# Patient Record
Sex: Female | Born: 1995 | Race: Black or African American | Hispanic: No | Marital: Single | Smoking: Never smoker
Health system: Southern US, Community
[De-identification: ages and names within clinical notes are randomized; demographics above are authoritative.]

## PROBLEM LIST (undated history)

## (undated) ENCOUNTER — Ambulatory Visit (HOSPITAL_COMMUNITY): Admission: EM | Discharge status: Absent without leave | Payer: BLUE CROSS/BLUE SHIELD

## (undated) ENCOUNTER — Ambulatory Visit

## (undated) DIAGNOSIS — F419 Anxiety disorder, unspecified: Secondary | ICD-10-CM

## (undated) DIAGNOSIS — E039 Hypothyroidism, unspecified: Secondary | ICD-10-CM

## (undated) HISTORY — DX: Anxiety disorder, unspecified: F41.9

## (undated) HISTORY — DX: Hypothyroidism, unspecified: E03.9

---
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
## 2017-02-08 ENCOUNTER — Ambulatory Visit: Payer: BLUE CROSS/BLUE SHIELD | Attending: Internal Medicine | Admitting: Internal Medicine

## 2017-02-08 ENCOUNTER — Encounter: Payer: Self-pay | Admitting: Internal Medicine

## 2017-02-08 VITALS — BP 116/73 | HR 98 | Temp 98.3°F | Resp 16 | Ht 65.0 in | Wt 248.0 lb

## 2017-02-08 DIAGNOSIS — Z8249 Family history of ischemic heart disease and other diseases of the circulatory system: Secondary | ICD-10-CM | POA: Insufficient documentation

## 2017-02-08 DIAGNOSIS — J309 Allergic rhinitis, unspecified: Secondary | ICD-10-CM | POA: Diagnosis not present

## 2017-02-08 DIAGNOSIS — E039 Hypothyroidism, unspecified: Secondary | ICD-10-CM | POA: Insufficient documentation

## 2017-02-08 DIAGNOSIS — Z79890 Hormone replacement therapy (postmenopausal): Secondary | ICD-10-CM | POA: Insufficient documentation

## 2017-02-08 DIAGNOSIS — Z803 Family history of malignant neoplasm of breast: Secondary | ICD-10-CM | POA: Diagnosis not present

## 2017-02-08 DIAGNOSIS — Z825 Family history of asthma and other chronic lower respiratory diseases: Secondary | ICD-10-CM | POA: Insufficient documentation

## 2017-02-08 DIAGNOSIS — Z807 Family history of other malignant neoplasms of lymphoid, hematopoietic and related tissues: Secondary | ICD-10-CM | POA: Diagnosis not present

## 2017-02-08 MED ORDER — FLUTICASONE PROPIONATE 50 MCG/ACT NA SUSP: 1.0000 | Freq: Every day | NASAL | 6 refills | Status: DC

## 2017-02-08 MED ORDER — LEVOTHYROXINE SODIUM 50 MCG PO TABS: 50.0000 ug | ORAL_TABLET | Freq: Every day | ORAL | 6 refills | Status: DC

## 2017-02-08 NOTE — Progress Notes (Signed)
Patient ID: Sarah Hart, female    DOB: [birth year]  MRN: [id number]  CC: New Patient (Initial Visit); Hypothyroidism; and URI   Subjective: Sarah Hart is a 22 y.o. female who presents for new pt visit.  Previous PCP was [facility] in [place], [place].  Pt is a Ship broker at [employer] Her concerns today include:  Pt with hx of hypothyroidism.  Dx at age 70.  She is on brand name Synthroid -last TSH was 03/2016. No hot/cold intolerance.  No constipation or diarrhea Forgets to take med 2-3 x a wk  Hx of allergies all yr round. Takes Benadryl at bedtime.  She switches b/w Benadryl, Claritin and Zyrtec.   Symptoms include nasal congestion, rhinorrhea and sinus headache    No current outpatient medications on file prior to visit.   No current facility-administered medications on file prior to visit.     Allergies not on file  Social History   Socioeconomic History  . Marital status: Single    Spouse name: Not on file  . Number of children: Not on file  . Years of education: Not on file  . Highest education level: Not on file  Social Needs  . Financial resource strain: Not on file  . Food insecurity - worry: Not on file  . Food insecurity - inability: Not on file  . Transportation needs - medical: Not on file  . Transportation needs - non-medical: Not on file  Occupational History  . Not on file  Tobacco Use  . Smoking status: Never Smoker  . Smokeless tobacco: Never Used  Substance and Sexual Activity  . Alcohol use: Yes    Comment: once or twice a month  . Drug use: No  . Sexual activity: No  Other Topics Concern  . Not on file  Social History Narrative  . Not on file    Family History  Problem Relation Age of Onset  . Hypertension Mother   . Breast cancer Mother        Mother was dx in 2009  . Hypertension Father   . Multiple myeloma Father 5  . Asthma Brother   . Pancreatic cancer Maternal Grandmother   . Prostate cancer Maternal  Grandfather     History reviewed. No pertinent surgical history.  ROS: Review of Systems Neg except as above PHYSICAL EXAM: BP 116/73   Pulse 98   Temp 98.3 F (36.8 C) (Oral)   Resp 16   Ht 5' 5"  (1.651 m)   Wt 248 lb (112.5 kg)   LMP 01/29/2017 Comment: Pt is on menstrual now  SpO2 98%   BMI 41.27 kg/m   Physical Exam  General appearance - alert, well appearing, young obese African-American female and in no distress Mental status - alert, oriented to person, place, and time, normal mood, behavior, speech, dress, motor activity, and thought processes Nose - normal and patent, no erythema, discharge or polyps Mouth - mucous membranes moist, pharynx normal without lesions Neck - supple, no significant adenopathy Chest - clear to auscultation, no wheezes, rales or rhonchi, symmetric air entry Heart - normal rate, regular rhythm, normal S1, S2, no murmurs, rubs, clicks or gallops Extremities - peripheral pulses normal, no pedal edema, no clubbing or cyanosis  ASSESSMENT AND PLAN: 1. Acquired hypothyroidism Encourage her to put her medication on the side of her toothbrush so that she remembers to take it every morning after brushing. - levothyroxine (SYNTHROID, LEVOTHROID) 50 MCG tablet; Take 1 tablet (50  mcg total) by mouth daily before breakfast.  Dispense: 30 tablet; Refill: 6 - TSH  2. Allergic rhinitis, unspecified seasonality, unspecified trigger - fluticasone (FLONASE) 50 MCG/ACT nasal spray; Place 1 spray into both nostrils daily.  Dispense: 16 g; Refill: 6 Patient was given the opportunity to ask questions.  Patient verbalized understanding of the plan and was able to repeat key elements of the plan.   Orders Placed This Encounter  Procedures  . TSH     Requested Prescriptions   Signed Prescriptions Disp Refills  . fluticasone (FLONASE) 50 MCG/ACT nasal spray 16 g 6    Sig: Place 1 spray into both nostrils daily.  Marland Kitchen levothyroxine (SYNTHROID, LEVOTHROID) 50 MCG  tablet 30 tablet 6    Sig: Take 1 tablet (50 mcg total) by mouth daily before breakfast.    Return in about 6 months (around 08/08/2017).  Karle Plumber, MD, FACP

## 2017-02-08 NOTE — Patient Instructions (Signed)
Hypothyroidism Hypothyroidism is a disorder of the thyroid. The thyroid is a large gland that is located in the lower front of the neck. The thyroid releases hormones that control how the body works. With hypothyroidism, the thyroid does not make enough of these hormones. What are the causes? Causes of hypothyroidism may include:  Viral infections.  Pregnancy.  Your own defense system (immune system) attacking your thyroid.  Certain medicines.  Birth defects.  Past radiation treatments to your head or neck.  Past treatment with radioactive iodine.  Past surgical removal of part or all of your thyroid.  Problems with the gland that is located in the center of your brain (pituitary).  What are the signs or symptoms? Signs and symptoms of hypothyroidism may include:  Feeling as though you have no energy (lethargy).  Inability to tolerate cold.  Weight gain that is not explained by a change in diet or exercise habits.  Dry skin.  Coarse hair.  Menstrual irregularity.  Slowing of thought processes.  Constipation.  Sadness or depression.  How is this diagnosed? Your health care provider may diagnose hypothyroidism with blood tests and ultrasound tests. How is this treated? Hypothyroidism is treated with medicine that replaces the hormones that your body does not make. After you begin treatment, it may take several weeks for symptoms to go away. Follow these instructions at home:  Take medicines only as directed by your health care provider.  If you start taking any new medicines, tell your health care provider.  Keep all follow-up visits as directed by your health care provider. This is important. As your condition improves, your dosage needs may change. You will need to have blood tests regularly so that your health care provider can watch your condition. Contact a health care provider if:  Your symptoms do not get better with treatment.  You are taking thyroid  replacement medicine and: ? You sweat excessively. ? You have tremors. ? You feel anxious. ? You lose weight rapidly. ? You cannot tolerate heat. ? You have emotional swings. ? You have diarrhea. ? You feel weak. Get help right away if:  You develop chest pain.  You develop an irregular heartbeat.  You develop a rapid heartbeat. This information is not intended to replace advice given to you by your health care provider. Make sure you discuss any questions you have with your health care provider. Document Released: 01/01/2005 Document Revised: 06/09/2015 Document Reviewed: 05/19/2013 Elsevier Interactive Patient Education  2018 [employer]. Allergic Rhinitis, Adult Allergic rhinitis is an allergic reaction that affects the mucous membrane inside the nose. It causes sneezing, a runny or stuffy nose, and the feeling of mucus going down the back of the throat (postnasal drip). Allergic rhinitis can be mild to severe. There are two types of allergic rhinitis:  Seasonal. This type is also called hay fever. It happens only during certain seasons.  Perennial. This type can happen at any time of the year.  What are the causes? This condition happens when the body's defense system (immune system) responds to certain harmless substances called allergens as though they were germs.  Seasonal allergic rhinitis is triggered by pollen, which can come from grasses, trees, and weeds. Perennial allergic rhinitis may be caused by:  House dust mites.  Pet dander.  Mold spores.  What are the signs or symptoms? Symptoms of this condition include:  Sneezing.  Runny or stuffy nose (nasal congestion).  Postnasal drip.  Itchy nose.  Tearing of the eyes.  Trouble sleeping.  Daytime sleepiness.  How is this diagnosed? This condition may be diagnosed based on:  Your medical history.  A physical exam.  Tests to check for related conditions, such as: ? Asthma. ? Pink eye. ? Ear  infection. ? Upper respiratory infection.  Tests to find out which allergens trigger your symptoms. These may include skin or blood tests.  How is this treated? There is no cure for this condition, but treatment can help control symptoms. Treatment may include:  Taking medicines that block allergy symptoms, such as antihistamines. Medicine may be given as a shot, nasal spray, or pill.  Avoiding the allergen.  Desensitization. This treatment involves getting ongoing shots until your body becomes less sensitive to the allergen. This treatment may be done if other treatments do not help.  If taking medicine and avoiding the allergen does not work, new, stronger medicines may be prescribed.  Follow these instructions at home:  Find out what you are allergic to. Common allergens include smoke, dust, and pollen.  Avoid the things you are allergic to. These are some things you can do to help avoid allergens: ? Replace carpet with wood, tile, or vinyl flooring. Carpet can trap dander and dust. ? Do not smoke. Do not allow smoking in your home. ? Change your heating and air conditioning filter at least once a month. ? During allergy season:  Keep windows closed as much as possible.  Plan outdoor activities when pollen counts are lowest. This is usually during the evening hours.  When coming indoors, change clothing and shower before sitting on furniture or bedding.  Take over-the-counter and prescription medicines only as told by your health care provider.  Keep all follow-up visits as told by your health care provider. This is important. Contact a health care provider if:  You have a fever.  You develop a persistent cough.  You make whistling sounds when you breathe (you wheeze).  Your symptoms interfere with your normal daily activities. Get help right away if:  You have shortness of breath. Summary  This condition can be managed by taking medicines as directed and avoiding  allergens.  Contact your health care provider if you develop a persistent cough or fever.  During allergy season, keep windows closed as much as possible. This information is not intended to replace advice given to you by your health care provider. Make sure you discuss any questions you have with your health care provider. Document Released: 09/26/2000 Document Revised: 02/09/2016 Document Reviewed: 02/09/2016 Elsevier Interactive Patient Education  [employer]2018 Else[employer] Inc[employer]

## 2017-02-09 LAB — TSH: TSH: 2.78 u[IU]/mL (ref 0.450–4.500)

## 2017-07-23 ENCOUNTER — Encounter: Payer: Self-pay | Admitting: Internal Medicine

## 2017-07-23 ENCOUNTER — Ambulatory Visit: Payer: BLUE CROSS/BLUE SHIELD | Attending: Internal Medicine | Admitting: Internal Medicine

## 2017-07-23 VITALS — BP 131/80 | HR 74 | Temp 98.2°F | Resp 16 | Wt 246.8 lb

## 2017-07-23 DIAGNOSIS — Z6841 Body Mass Index (BMI) 40.0 and over, adult: Secondary | ICD-10-CM | POA: Diagnosis not present

## 2017-07-23 DIAGNOSIS — Z79899 Other long term (current) drug therapy: Secondary | ICD-10-CM | POA: Insufficient documentation

## 2017-07-23 DIAGNOSIS — N926 Irregular menstruation, unspecified: Secondary | ICD-10-CM | POA: Diagnosis not present

## 2017-07-23 DIAGNOSIS — Z79890 Hormone replacement therapy (postmenopausal): Secondary | ICD-10-CM | POA: Diagnosis not present

## 2017-07-23 DIAGNOSIS — J309 Allergic rhinitis, unspecified: Secondary | ICD-10-CM | POA: Insufficient documentation

## 2017-07-23 DIAGNOSIS — E039 Hypothyroidism, unspecified: Secondary | ICD-10-CM | POA: Insufficient documentation

## 2017-07-23 DIAGNOSIS — Z713 Dietary counseling and surveillance: Secondary | ICD-10-CM | POA: Diagnosis not present

## 2017-07-23 DIAGNOSIS — E66813 Obesity, class 3: Secondary | ICD-10-CM | POA: Insufficient documentation

## 2017-07-23 DIAGNOSIS — Z8249 Family history of ischemic heart disease and other diseases of the circulatory system: Secondary | ICD-10-CM | POA: Diagnosis not present

## 2017-07-23 NOTE — Patient Instructions (Addendum)
Follow a Healthy Eating Plan - You can do it! Limit sugary drinks.  Avoid sodas, sweet tea, sport or energy drinks, or fruit drinks.  Drink water, lo-fat milk, or diet drinks. Limit snack foods.   Cut back on candy, cake, cookies, chips, ice cream.  These are a special treat, only in small amounts. Eat plenty of vegetables.  Especially dark green, red, and orange vegetables. Aim for at least 3 servings a day. More is better! Include fruit in your daily diet.  Whole fruit is much healthier than fruit juice! Limit "white" bread, "white" pasta, "white" rice.   Choose "100% whole grain" products, brown or wild rice. Avoid fatty meats. Try "Meatless Monday" and choose eggs or beans one day a week.  When eating meat, choose lean meats like chicken, [place], and fish.  Grill, broil, or bake meats instead of frying, and eat poultry without the skin. Eat less salt.  Avoid frozen pizzas, frozen dinners and salty foods.  Use seasonings other than salt in cooking.  This can help blood pressure and keep you from swelling Beer, wine and liquor have calories.  If you can safely drink alcohol, limit to 1 drink per day for women, 2 drinks for men   Polycystic Ovarian Syndrome Polycystic ovarian syndrome (PCOS) is a common hormonal disorder among women of reproductive age. In most women with PCOS, many small fluid-filled sacs (cysts) grow on the ovaries, and the cysts are not part of a normal menstrual cycle. PCOS can cause problems with your menstrual periods and make it difficult to get pregnant. It can also cause an increased risk of miscarriage with pregnancy. If it is not treated, PCOS can lead to serious health problems, such as diabetes and heart disease. What are the causes? The cause of PCOS is not known, but it may be the result of a combination of certain factors, such as:  Irregular menstrual cycle.  High levels of certain hormones (androgens).  Problems with the hormone that helps to control blood  sugar (insulin resistance).  Certain genes.  What increases the risk? This condition is more likely to develop in women who have a family history of PCOS. What are the signs or symptoms? Symptoms of PCOS may include:  Multiple ovarian cysts.  Infrequent periods or no periods.  Periods that are too frequent or too heavy.  Unpredictable periods.  Inability to get pregnant (infertility) because of not ovulating.  Increased growth of hair on the face, chest, stomach, back, thumbs, thighs, or toes.  Acne or oily skin. Acne may develop during adulthood, and it may not respond to treatment.  Pelvic pain.  Weight gain or obesity.  Patches of thickened and dark brown or black skin on the neck, arms, breasts, or thighs (acanthosis nigricans).  Excess hair growth on the face, chest, abdomen, or upper thighs (hirsutism).  How is this diagnosed? This condition is diagnosed based on:  Your medical history.  A physical exam, including a pelvic exam. Your health care provider may look for areas of increased hair growth on your skin.  Tests, such as: ? Ultrasound. This may be used to examine the ovaries and the lining of the uterus (endometrium) for cysts. ? Blood tests. These may be used to check levels of sugar (glucose), female hormone (testosterone), and female hormones (estrogen and progesterone) in your blood.  How is this treated? There is no cure for PCOS, but treatment can help to manage symptoms and prevent more health problems from developing. Treatment  varies depending on:  Your symptoms.  Whether you want to have a baby or whether you need birth control (contraception).  Treatment may include nutrition and lifestyle changes along with:  Progesterone hormone to start a menstrual period.  Birth control pills to help you have regular menstrual periods.  Medicines to make you ovulate, if you want to get pregnant.  Medicine to reduce excessive hair growth.  Surgery, in  severe cases. This may involve making small holes in one or both of your ovaries. This decreases the amount of testosterone that your body produces.  Follow these instructions at home:  Take over-the-counter and prescription medicines only as told by your health care provider.  Follow a healthy meal plan. This can help you reduce the effects of PCOS. ? Eat a healthy diet that includes lean proteins, complex carbohydrates, fresh fruits and vegetables, low-fat dairy products, and healthy fats. Make sure to eat enough fiber.  If you are overweight, lose weight as told by your health care provider. ? Losing 10% of your body weight may improve symptoms. ? Your health care provider can determine how much weight loss is best for you and can help you lose weight safely.  Keep all follow-up visits as told by your health care provider. This is important. Contact a health care provider if:  Your symptoms do not get better with medicine.  You develop new symptoms. This information is not intended to replace advice given to you by your health care provider. Make sure you discuss any questions you have with your health care provider. Document Released: 04/27/2004 Document Revised: 08/30/2015 Document Reviewed: 06/19/2015 Elsevier Interactive Patient Education  [employer]2018 Elsevier Inc.

## 2017-07-23 NOTE — Progress Notes (Signed)
Patient ID: Sarah Hart, female    DOB: 12/03/1995  MRN: [id number]  CC: Hypothyroidism   Subjective: Sarah Hart is a 22 y.o. female who presents for routine follow-up visit. Her concerns today include:  Patient with history of hypothyroidism and obesity.  Thyroid:  Compliant with levothyroxine. She reports irregular menses for the past 1 to 2 years.menses but sometimes skip 2-1/2 months.  Cycles last up to 2 weeks with moderate bleeding.  The last week of the menstrual cycle is usually very light.  Last menses was April 2019.  He is not sexually active and has never been.  She reports a little growth of hair under the chin.  She has never had a Pap smear.  Obesity: Going to gym almost daily for the past few weeks.  She is trying to lead a healthier lifestyle.     Patient Active Problem List   Diagnosis Date Noted  . Acquired hypothyroidism 02/08/2017  . Allergic rhinitis 02/08/2017     Current Outpatient Medications on File Prior to Visit  Medication Sig Dispense Refill  . fluticasone (FLONASE) 50 MCG/ACT nasal spray Place 1 spray into both nostrils daily. 16 g 6  . levothyroxine (SYNTHROID, LEVOTHROID) 50 MCG tablet Take 1 tablet (50 mcg total) by mouth daily before breakfast. 30 tablet 6   No current facility-administered medications on file prior to visit.     Not on File  Social History   Socioeconomic History  . Marital status: Single    Spouse name: Not on file  . Number of children: Not on file  . Years of education: Not on file  . Highest education level: Not on file  Occupational History  . Not on file  Social Needs  . Financial resource strain: Not on file  . Food insecurity:    Worry: Not on file    Inability: Not on file  . Transportation needs:    Medical: Not on file    Non-medical: Not on file  Tobacco Use  . Smoking status: Never Smoker  . Smokeless tobacco: Never Used  Substance and Sexual Activity  . Alcohol use: Yes     Comment: once or twice a month  . Drug use: No  . Sexual activity: Never  Lifestyle  . Physical activity:    Days per week: Not on file    Minutes per session: Not on file  . Stress: Not on file  Relationships  . Social connections:    Talks on phone: Not on file    Gets together: Not on file    Attends religious service: Not on file    Active member of club or organization: Not on file    Attends meetings of clubs or organizations: Not on file    Relationship status: Not on file  . Intimate partner violence:    Fear of current or ex partner: Not on file    Emotionally abused: Not on file    Physically abused: Not on file    Forced sexual activity: Not on file  Other Topics Concern  . Not on file  Social History Narrative  . Not on file    Family History  Problem Relation Age of Onset  . Hypertension Mother   . Breast cancer Mother        Mother was dx in 2009  . Hypertension Father   . Multiple myeloma Father 75  . Asthma Brother   . Pancreatic cancer Maternal Grandmother   .  Prostate cancer Maternal Grandfather     No past surgical history on file.  ROS: Review of Systems Negative except as stated above PHYSICAL EXAM: BP 131/80   Pulse 74   Temp 98.2 F (36.8 C) (Oral)   Resp 16   Wt 246 lb 12.8 oz (111.9 kg)   SpO2 97%   BMI 41.07 kg/m   Physical Exam  General appearance - alert, well appearing, and in no distress Mental status - normal mood, behavior, speech, dress, motor activity, and thought processes Neck - supple, no significant adenopathy Chest - clear to auscultation, no wheezes, rales or rhonchi, symmetric air entry Heart - normal rate, regular rhythm, normal S1, S2, no murmurs, rubs, clicks or gallops Skin -mild hyperpigmentation around the posterior neck line and the axilla  Lab Results  Component Value Date   TSH 2.780 02/08/2017    ASSESSMENT AND PLAN: 1. Irregular menses -Likely due to PCOS.  I had a discussion with her of what  PCOS is.  We discussed options to regulate her menses including use of birth control pills since she is not sexually active and does not desire pregnancy at this time.  Other option would be to give 1 week of Provera the first week of every month to induce withdrawal bleeding.  Patient states that she will think about it and let me know on next visit.  We also need to recheck thyroid level to make sure she is still within normal range. -We discussed recommendation for starting Pap smear screening at age 68.  However patient has never been sexually active and wants to hold off at this time.  2. Acquired hypothyroidism - TSH  3. Class 3 severe obesity without serious comorbidity with body mass index (BMI) of 40.0 to 44.9 in adult, unspecified obesity type ([place]) Commended her on starting an exercise program.  Dietary counseling given including cutting back on white carbohydrates, sugary drinks and red meat.  Patient was given the opportunity to ask questions.  Patient verbalized understanding of the plan and was able to repeat key elements of the plan.   Orders Placed This Encounter  Procedures  . TSH     Requested Prescriptions    No prescriptions requested or ordered in this encounter    Return in about 5 months (around 12/23/2017).  Karle Plumber, MD, FACP

## 2017-07-24 LAB — TSH: TSH: 2.96 u[IU]/mL (ref 0.450–4.500)

## 2017-07-27 ENCOUNTER — Encounter: Payer: Self-pay | Admitting: Internal Medicine

## 2017-10-20 ENCOUNTER — Ambulatory Visit (HOSPITAL_COMMUNITY)
Admission: EM | Admit: 2017-10-20 | Discharge: 2017-10-20 | Discharge status: Absent without leave | Payer: BLUE CROSS/BLUE SHIELD

## 2017-11-04 ENCOUNTER — Ambulatory Visit: Payer: BLUE CROSS/BLUE SHIELD | Admitting: Internal Medicine

## 2017-11-08 ENCOUNTER — Encounter: Payer: Self-pay | Admitting: Internal Medicine

## 2017-11-08 ENCOUNTER — Ambulatory Visit: Payer: BLUE CROSS/BLUE SHIELD | Attending: Internal Medicine | Admitting: Internal Medicine

## 2017-11-08 VITALS — BP 120/78 | HR 90 | Temp 98.0°F | Resp 16 | Wt 245.4 lb

## 2017-11-08 DIAGNOSIS — R109 Unspecified abdominal pain: Secondary | ICD-10-CM

## 2017-11-08 DIAGNOSIS — E039 Hypothyroidism, unspecified: Secondary | ICD-10-CM | POA: Insufficient documentation

## 2017-11-08 DIAGNOSIS — Z79890 Hormone replacement therapy (postmenopausal): Secondary | ICD-10-CM | POA: Insufficient documentation

## 2017-11-08 DIAGNOSIS — Z6841 Body Mass Index (BMI) 40.0 and over, adult: Secondary | ICD-10-CM | POA: Insufficient documentation

## 2017-11-08 DIAGNOSIS — Z2821 Immunization not carried out because of patient refusal: Secondary | ICD-10-CM

## 2017-11-08 DIAGNOSIS — N926 Irregular menstruation, unspecified: Secondary | ICD-10-CM | POA: Diagnosis not present

## 2017-11-08 DIAGNOSIS — Z8249 Family history of ischemic heart disease and other diseases of the circulatory system: Secondary | ICD-10-CM | POA: Insufficient documentation

## 2017-11-08 LAB — POCT URINALYSIS DIP (CLINITEK)
BILIRUBIN UA: NEGATIVE
BILIRUBIN UA: NEGATIVE mg/dL
Glucose, UA: NEGATIVE mg/dL
LEUKOCYTES UA: NEGATIVE
Nitrite, UA: NEGATIVE
POC,PROTEIN,UA: NEGATIVE
SPEC GRAV UA: 1.015 (ref 1.010–1.025)
Urobilinogen, UA: 1 E.U./dL
pH, UA: 6.5 (ref 5.0–8.0)

## 2017-11-08 MED ORDER — MEDROXYPROGESTERONE ACETATE 10 MG PO TABS: ORAL_TABLET | ORAL | 3 refills | Status: DC

## 2017-11-08 NOTE — Progress Notes (Signed)
Patient ID: Sarah Hart, female    DOB: Nov 16, 1995  MRN: [id number]  CC: Follow-up   Subjective: Sarah Hart is a 22 y.o. female who presents for chronic ds management. Her concerns today include:  Patient with history of hypothyroidism and obesity.  C/o soreness LT flank x 1 mth.  No initiating factors. No radiation Most noticeable when she lays down No dysuria or urinary frequency.  No hematuria  Irregular menses: Patient states that she thought about what we discussed on last visit and did some reading on PCOS.  She would like to be tried with birth control pills or Provera to induce regular cycles.  Reports that menses are still irregular.  Last cycle was in July of this year.  She is not sexually active.  She is also agreeable to having Pap smear done.    Patient Active Problem List   Diagnosis Date Noted  . Irregular menses 07/23/2017  . Class 3 severe obesity without serious comorbidity with body mass index (BMI) of 40.0 to 44.9 in adult ([place]) 07/23/2017  . Acquired hypothyroidism 02/08/2017  . Allergic rhinitis 02/08/2017     Current Outpatient Medications on File Prior to Visit  Medication Sig Dispense Refill  . fluticasone (FLONASE) 50 MCG/ACT nasal spray Place 1 spray into both nostrils daily. 16 g 6  . levothyroxine (SYNTHROID, LEVOTHROID) 50 MCG tablet Take 1 tablet (50 mcg total) by mouth daily before breakfast. 30 tablet 6   No current facility-administered medications on file prior to visit.     Not on File  Social History   Socioeconomic History  . Marital status: Single    Spouse name: Not on file  . Number of children: Not on file  . Years of education: Not on file  . Highest education level: Not on file  Occupational History  . Not on file  Social Needs  . Financial resource strain: Not on file  . Food insecurity:    Worry: Not on file    Inability: Not on file  . Transportation needs:    Medical: Not on file    Non-medical: Not on  file  Tobacco Use  . Smoking status: Never Smoker  . Smokeless tobacco: Never Used  Substance and Sexual Activity  . Alcohol use: Yes    Comment: once or twice a month  . Drug use: No  . Sexual activity: Never  Lifestyle  . Physical activity:    Days per week: Not on file    Minutes per session: Not on file  . Stress: Not on file  Relationships  . Social connections:    Talks on phone: Not on file    Gets together: Not on file    Attends religious service: Not on file    Active member of club or organization: Not on file    Attends meetings of clubs or organizations: Not on file    Relationship status: Not on file  . Intimate partner violence:    Fear of current or ex partner: Not on file    Emotionally abused: Not on file    Physically abused: Not on file    Forced sexual activity: Not on file  Other Topics Concern  . Not on file  Social History Narrative  . Not on file    Family History  Problem Relation Age of Onset  . Hypertension Mother   . Breast cancer Mother        Mother was dx in 2009  .  Hypertension Father   . Multiple myeloma Father 12  . Asthma Brother   . Pancreatic cancer Maternal Grandmother   . Prostate cancer Maternal Grandfather     No past surgical history on file.  ROS: Review of Systems Negative except as above   PHYSICAL EXAM: BP 120/78   Pulse 90   Temp 98 F (36.7 C) (Oral)   Resp 16   Wt 245 lb 6.4 oz (111.3 kg)   SpO2 98%   BMI 40.84 kg/m   Wt Readings from Last 3 Encounters:  11/08/17 245 lb 6.4 oz (111.3 kg)  07/23/17 246 lb 12.8 oz (111.9 kg)  02/08/17 248 lb (112.5 kg)   Physical Exam  General appearance - alert, well appearing, and in no distress Mental status - normal mood, behavior, speech, dress, motor activity, and thought processes Abdomen - soft, nontender, nondistended, no masses or organomegaly Musculoskeletal -no flank tenderness.  No tenderness on palpation of the thoracic and lumbar spine  Results for  orders placed or performed in visit on 11/08/17  POCT URINALYSIS DIP (CLINITEK)  Result Value Ref Range   Color, UA yellow yellow   Clarity, UA clear clear   Glucose, UA negative negative mg/dL   Bilirubin, UA negative negative   Ketones, POC UA negative negative mg/dL   Spec Grav, UA 1.015 1.010 - 1.025   Blood, UA small (A) negative   pH, UA 6.5 5.0 - 8.0   POC Protein UA Negative Negative, Trace   Urobilinogen, UA 1.0 0.2 or 1.0 E.U./dL   Nitrite, UA Negative Negative   Leukocytes, UA Negative Negative    ASSESSMENT AND PLAN: 1. Flank pain Likely MSK in nature.  I recommend using some over-the-counter icy hot rub or BenGay. Urine dip was positive for small amount of blood.  We will check a UA with blood count to see if there is RBCs in the urine. - POCT URINALYSIS DIP (CLINITEK)  2. Irregular menses Most likely anovulatory cycle due to PCOS.  She has history of hypothyroidism but TSH level has been good on last 2 checks. -We discussed risks and benefits of birth control pills or Provera.  Patient informed of risks of blood clots more so with combined birth control pills.  She opted to try the Provera.  She will take 1 tablet daily for the first 10 days of each month and should have withdrawal bleeding several days after stopping the Provera.  We will plan to do her Pap smear on follow-up visit in 6 weeks Patient declined urine pregnancy test today because she is not sexually active. - medroxyPROGESTERone (PROVERA) 10 MG tablet; 1 tab PO daily for the first 10 days of each month  Dispense: 10 tablet; Refill: 3  3. Influenza vaccination declined   Patient was given the opportunity to ask questions.  Patient verbalized understanding of the plan and was able to repeat key elements of the plan.   Orders Placed This Encounter  Procedures  . POCT URINALYSIS DIP (CLINITEK)     Requested Prescriptions   Signed Prescriptions Disp Refills  . medroxyPROGESTERone (PROVERA) 10 MG  tablet 10 tablet 3    Sig: 1 tab PO daily for the first 10 days of each month    Return in about 6 weeks (around 12/20/2017) for PAP.  Karle Plumber, MD, FACP

## 2017-11-08 NOTE — Patient Instructions (Signed)
Try [facility] Rub to the left flank.  This can be purchased over the counter.

## 2017-11-20 ENCOUNTER — Encounter: Payer: Self-pay | Admitting: Internal Medicine

## 2017-12-20 ENCOUNTER — Encounter: Payer: Self-pay | Admitting: Internal Medicine

## 2017-12-20 ENCOUNTER — Other Ambulatory Visit (HOSPITAL_COMMUNITY)
Admission: RE | Admit: 2017-12-20 | Discharge: 2017-12-20 | Disposition: A | Discharge status: Home / Self Care | Payer: BLUE CROSS/BLUE SHIELD | Source: Ambulatory Visit | Attending: Internal Medicine | Admitting: Internal Medicine

## 2017-12-20 ENCOUNTER — Ambulatory Visit: Payer: BLUE CROSS/BLUE SHIELD | Attending: Internal Medicine | Admitting: Internal Medicine

## 2017-12-20 VITALS — BP 122/82 | HR 72 | Temp 98.5°F | Resp 16 | Wt 247.8 lb

## 2017-12-20 DIAGNOSIS — N926 Irregular menstruation, unspecified: Secondary | ICD-10-CM | POA: Diagnosis not present

## 2017-12-20 DIAGNOSIS — Z803 Family history of malignant neoplasm of breast: Secondary | ICD-10-CM

## 2017-12-20 DIAGNOSIS — Z793 Long term (current) use of hormonal contraceptives: Secondary | ICD-10-CM | POA: Insufficient documentation

## 2017-12-20 DIAGNOSIS — Z6841 Body Mass Index (BMI) 40.0 and over, adult: Secondary | ICD-10-CM | POA: Diagnosis not present

## 2017-12-20 DIAGNOSIS — Z8249 Family history of ischemic heart disease and other diseases of the circulatory system: Secondary | ICD-10-CM | POA: Diagnosis not present

## 2017-12-20 DIAGNOSIS — N925 Other specified irregular menstruation: Secondary | ICD-10-CM | POA: Diagnosis not present

## 2017-12-20 DIAGNOSIS — Z124 Encounter for screening for malignant neoplasm of cervix: Secondary | ICD-10-CM | POA: Diagnosis not present

## 2017-12-20 DIAGNOSIS — E039 Hypothyroidism, unspecified: Secondary | ICD-10-CM | POA: Insufficient documentation

## 2017-12-20 DIAGNOSIS — Z79890 Hormone replacement therapy (postmenopausal): Secondary | ICD-10-CM | POA: Diagnosis not present

## 2017-12-20 NOTE — Patient Instructions (Signed)
You should inquire from your mother whether you had the HPV vaccines.

## 2017-12-20 NOTE — Progress Notes (Signed)
Patient ID: Sarah Hart, female    DOB: Jan 28, 1995  MRN: [id number]  CC: Gynecologic Exam   Subjective: Sarah Hart is a 22 y.o. female who presents for f/u on irregular menses and for PAP Her concerns today include:  Patient with history of hypothyroidism, and obesity.  Pt is G0P0 She is not sexually active.  She is not sure she received HPV vaccines This is her first pap smear. No vaginal dischg or irritation Menses: Menses have been irregular for the past 2 years.  Prior to that she had regular menses that came every month.  On last visit we gave her prescription for Provera.  Had withdrawal bleeding with Provera but bleeding was very heavy and lasted 2 wks. menses would usually last 2 weeks when she did have them prior to Provera but with Provera "it was the heaviest period I've ever had."    No family history of uterine or ovarian cancer. History of breast cancer in mother.  Her mother was diagnosed in her late 60s or early 77s.  Patient Active Problem List   Diagnosis Date Noted  . Irregular menses 07/23/2017  . Class 3 severe obesity without serious comorbidity with body mass index (BMI) of 40.0 to 44.9 in adult ([place]) 07/23/2017  . Acquired hypothyroidism 02/08/2017  . Allergic rhinitis 02/08/2017     Current Outpatient Medications on File Prior to Visit  Medication Sig Dispense Refill  . fluticasone (FLONASE) 50 MCG/ACT nasal spray Place 1 spray into both nostrils daily. 16 g 6  . levothyroxine (SYNTHROID, LEVOTHROID) 50 MCG tablet Take 1 tablet (50 mcg total) by mouth daily before breakfast. 30 tablet 6  . medroxyPROGESTERone (PROVERA) 10 MG tablet 1 tab PO daily for the first 10 days of each month (Patient not taking: Reported on 12/20/2017) 10 tablet 3   No current facility-administered medications on file prior to visit.     No Known Allergies  Social History   Socioeconomic History  . Marital status: Single    Spouse name: Not on file  . Number of  children: Not on file  . Years of education: Not on file  . Highest education level: Not on file  Occupational History  . Not on file  Social Needs  . Financial resource strain: Not on file  . Food insecurity:    Worry: Not on file    Inability: Not on file  . Transportation needs:    Medical: Not on file    Non-medical: Not on file  Tobacco Use  . Smoking status: Never Smoker  . Smokeless tobacco: Never Used  Substance and Sexual Activity  . Alcohol use: Yes    Comment: once or twice a month  . Drug use: No  . Sexual activity: Never  Lifestyle  . Physical activity:    Days per week: Not on file    Minutes per session: Not on file  . Stress: Not on file  Relationships  . Social connections:    Talks on phone: Not on file    Gets together: Not on file    Attends religious service: Not on file    Active member of club or organization: Not on file    Attends meetings of clubs or organizations: Not on file    Relationship status: Not on file  . Intimate partner violence:    Fear of current or ex partner: Not on file    Emotionally abused: Not on file    Physically abused: Not  on file    Forced sexual activity: Not on file  Other Topics Concern  . Not on file  Social History Narrative  . Not on file    Family History  Problem Relation Age of Onset  . Hypertension Mother   . Breast cancer Mother        Mother was dx in 2009  . Hypertension Father   . Multiple myeloma Father 50  . Asthma Brother   . Pancreatic cancer Maternal Grandmother   . Prostate cancer Maternal Grandfather     No past surgical history on file.  ROS: Review of Systems Negative except as above. PHYSICAL EXAM: BP 130/83   Pulse 72   Temp 98.5 F (36.9 C) (Oral)   Resp 16   Wt 247 lb 12.8 oz (112.4 kg)   SpO2 100%   BMI 41.24 kg/m   BP 122/82 Physical Exam  General appearance - alert, well appearing, and in no distress Mental status - normal mood, behavior, speech, dress, motor  activity, and thought processes Pelvic - normal external genitalia, vulva, vagina, cervix, uterus and adnexa  ASSESSMENT AND PLAN: 1. Pap smear for cervical cancer screening Advised patient to check with her mother to inquire whether she has had the HPV vaccines. She also one may want to inquire whether her mother was checked for the BRCA gene given that she was diagnosed with breast cancer at such a young age - Cytology - PAP  2. Irregular menses We will have her stop the Provera.  We discussed changing her to birth control pills to regulate her cycles.  Patient states that she will think about it and let me know.  She has no absolute contraindications to being on birth control pills.  I went over possible side effects of being on birth control pills including risks of DVT/PE  3.  Family history of breast cancer in mother  Patient was given the opportunity to ask questions.  Patient verbalized understanding of the plan and was able to repeat key elements of the plan.   No orders of the defined types were placed in this encounter.    Requested Prescriptions    No prescriptions requested or ordered in this encounter    No follow-ups on file.  Karle Plumber, MD, FACP

## 2017-12-20 NOTE — Progress Notes (Signed)
Pt wants to discuss the provera

## 2017-12-23 LAB — CYTOLOGY - PAP: Diagnosis: NEGATIVE

## 2018-02-11 ENCOUNTER — Other Ambulatory Visit: Payer: Self-pay | Admitting: Internal Medicine

## 2018-02-11 DIAGNOSIS — E039 Hypothyroidism, unspecified: Secondary | ICD-10-CM

## 2018-02-24 ENCOUNTER — Encounter: Payer: Self-pay | Admitting: Internal Medicine

## 2018-06-24 ENCOUNTER — Other Ambulatory Visit: Payer: Self-pay | Admitting: *Deleted

## 2018-06-24 DIAGNOSIS — Z20822 Contact with and (suspected) exposure to covid-19: Secondary | ICD-10-CM

## 2018-06-26 LAB — NOVEL CORONAVIRUS, NAA: SARS-CoV-2, NAA: NOT DETECTED

## 2018-08-14 ENCOUNTER — Other Ambulatory Visit: Payer: Self-pay | Admitting: Internal Medicine

## 2018-08-14 DIAGNOSIS — E039 Hypothyroidism, unspecified: Secondary | ICD-10-CM

## 2018-08-15 ENCOUNTER — Encounter: Payer: Self-pay | Admitting: Internal Medicine

## 2018-08-15 ENCOUNTER — Other Ambulatory Visit: Payer: Self-pay

## 2018-08-15 DIAGNOSIS — E039 Hypothyroidism, unspecified: Secondary | ICD-10-CM

## 2018-08-15 MED ORDER — SYNTHROID 50 MCG PO TABS: 50.0000 ug | ORAL_TABLET | Freq: Every day | ORAL | 0 refills | Status: DC

## 2018-08-15 NOTE — Telephone Encounter (Signed)
Patient mychart response

## 2018-08-21 ENCOUNTER — Ambulatory Visit: Payer: BC Managed Care – PPO | Attending: Internal Medicine | Admitting: Physician Assistant

## 2018-08-21 ENCOUNTER — Other Ambulatory Visit: Payer: Self-pay

## 2018-08-21 DIAGNOSIS — E039 Hypothyroidism, unspecified: Secondary | ICD-10-CM

## 2018-08-21 NOTE — Progress Notes (Signed)
Patient ID: Sarah Hart, female   DOB: 1995/04/02, 23 y.o.   MRN: [id number]   Sarah Hart, is a 23 y.o. female  QAS:[id number]  [id number]:[id number]  DOB - 29-Oct-1995  Subjective:  Chief Complaint and HPI: Sarah Hart is a 23 y.o. female here today for f/up on hypothyroid.  Not completely out of meds.  Needs RF.  No bloodwork in almost 1 year.  Denies hair loss, fatigue, weight loss or gain.  No problems or concerns.  Doing well.  ROS:   Constitutional:  No f/c, No night sweats, No unexplained weight loss. EENT:  No vision changes, No blurry vision, No hearing changes. No mouth, throat, or ear problems.  Respiratory: No cough, No SOB Cardiac: No CP, no palpitations GI:  No abd pain, No N/V/D. GU: No Urinary s/sx Musculoskeletal: No joint pain Neuro: No headache, no dizziness, no motor weakness.  Skin: No rash Endocrine:  No polydipsia. No polyuria.  Psych: Denies SI/HI  No problems updated.  ALLERGIES: No Known Allergies  PAST MEDICAL HISTORY: Past Medical History:  Diagnosis Date  . Hypothyroidism     MEDICATIONS AT HOME: Prior to Admission medications   Medication Sig Start Date End Date Taking? Authorizing Provider  fluticasone (FLONASE) 50 MCG/ACT nasal spray Place 1 spray into both nostrils daily. 02/08/17  Yes Ladell Pier, MD  SYNTHROID 50 MCG tablet Take 1 tablet (50 mcg total) by mouth daily. 08/15/18  Yes Ladell Pier, MD  medroxyPROGESTERone (PROVERA) 10 MG tablet 1 tab PO daily for the first 10 days of each month Patient not taking: Reported on 12/20/2017 11/08/17   Ladell Pier, MD     Objective:  EXAM:   Vitals:   08/21/18 1526  BP: 126/80  Pulse: 78  Temp: 98.5 F (36.9 C)  TempSrc: Oral  SpO2: 99%  Weight: 256 lb 3.2 oz (116.2 kg)    General appearance : A&OX3. NAD. Non-toxic-appearing HEENT: Atraumatic and Normocephalic.  PERRLA. EOM intact.  Chest/Lungs:  Breathing-non-labored, Good air entry bilaterally, breath  sounds normal without rales, rhonchi, or wheezing  CVS: S1 S2 regular, no murmurs, gallops, rubs  Neurology:  CN II-XII grossly intact, Non focal.   Psych:  TP linear. J/I WNL. Normal speech. Appropriate eye contact and affect.  Skin:  No Rash  Data Review No results found for: HGBA1C   Assessment & Plan   1. Acquired hypothyroidism Will assess for control and send Rx when labs are back - Thyroid Panel With TSH - Basic metabolic panel   Patient have been counseled extensively about nutrition and exercise  Return in about 6 months (around 02/21/2019) for PCP hypothyroidism.  The patient was given clear instructions to go to ER or return to medical center if symptoms don't improve, worsen or new problems develop. The patient verbalized understanding. The patient was told to call to get lab results if they haven't heard anything in the next week.     Freeman Caldron, PA-C [facility] [place], [place]   08/21/2018, 3:36 PM

## 2018-08-22 ENCOUNTER — Other Ambulatory Visit: Payer: Self-pay | Admitting: Physician Assistant

## 2018-08-22 DIAGNOSIS — E039 Hypothyroidism, unspecified: Secondary | ICD-10-CM

## 2018-08-22 LAB — BASIC METABOLIC PANEL
BUN/Creatinine Ratio: 12 (ref 9–23)
BUN: 9 mg/dL (ref 6–20)
CO2: 22 mmol/L (ref 20–29)
Calcium: 9.4 mg/dL (ref 8.7–10.2)
Chloride: 104 mmol/L (ref 96–106)
Creatinine, Ser: 0.78 mg/dL (ref 0.57–1.00)
GFR calc Af Amer: 124 mL/min/{1.73_m2} (ref >59)
GFR calc non Af Amer: 107 mL/min/{1.73_m2} (ref >59)
Glucose: 91 mg/dL (ref 65–99)
Potassium: 4.1 mmol/L (ref 3.5–5.2)
Sodium: 141 mmol/L (ref 134–144)

## 2018-08-22 LAB — THYROID PANEL WITH TSH
Free Thyroxine Index: 2.3 (ref 1.2–4.9)
T3 Uptake Ratio: 21 % — ABNORMAL LOW (ref 24–39)
T4, Total: 10.9 ug/dL (ref 4.5–12.0)
TSH: 4.17 u[IU]/mL (ref 0.450–4.500)

## 2018-08-22 MED ORDER — SYNTHROID 50 MCG PO TABS
50.0000 ug | ORAL_TABLET | Freq: Every day | ORAL | 1 refills | Status: DC
Start: 2018-08-22 — End: 2019-07-21

## 2018-08-25 ENCOUNTER — Telehealth: Payer: Self-pay | Admitting: *Deleted

## 2018-08-25 DIAGNOSIS — J309 Allergic rhinitis, unspecified: Secondary | ICD-10-CM

## 2018-08-25 MED ORDER — FLUTICASONE PROPIONATE 50 MCG/ACT NA SUSP: 1.0000 | Freq: Every day | NASAL | 0 refills | Status: DC

## 2018-09-15 ENCOUNTER — Other Ambulatory Visit: Payer: Self-pay | Admitting: Internal Medicine

## 2018-09-15 DIAGNOSIS — E039 Hypothyroidism, unspecified: Secondary | ICD-10-CM

## 2018-09-21 ENCOUNTER — Other Ambulatory Visit: Payer: Self-pay | Admitting: Internal Medicine

## 2018-09-21 DIAGNOSIS — J309 Allergic rhinitis, unspecified: Secondary | ICD-10-CM

## 2018-09-30 ENCOUNTER — Encounter: Payer: Self-pay | Admitting: Internal Medicine

## 2018-09-30 NOTE — Telephone Encounter (Signed)
Please refill if appropriate

## 2018-10-26 ENCOUNTER — Other Ambulatory Visit: Payer: Self-pay | Admitting: Internal Medicine

## 2018-10-26 DIAGNOSIS — J309 Allergic rhinitis, unspecified: Secondary | ICD-10-CM

## 2018-11-13 ENCOUNTER — Encounter: Payer: Self-pay | Admitting: Internal Medicine

## 2018-11-14 ENCOUNTER — Other Ambulatory Visit: Payer: Self-pay | Admitting: Internal Medicine

## 2018-11-14 DIAGNOSIS — Z20822 Contact with and (suspected) exposure to covid-19: Secondary | ICD-10-CM

## 2018-11-14 DIAGNOSIS — Z20828 Contact with and (suspected) exposure to other viral communicable diseases: Secondary | ICD-10-CM

## 2018-11-17 ENCOUNTER — Other Ambulatory Visit: Payer: Self-pay

## 2018-11-17 DIAGNOSIS — Z20822 Contact with and (suspected) exposure to covid-19: Secondary | ICD-10-CM

## 2018-11-19 LAB — NOVEL CORONAVIRUS, NAA: SARS-CoV-2, NAA: NOT DETECTED

## 2018-11-20 ENCOUNTER — Telehealth: Payer: Self-pay | Admitting: Internal Medicine

## 2018-11-20 NOTE — Telephone Encounter (Signed)
Patient called in returning a missed call. Patient states that she did receive her results in [place]. Patient expresses understanding.

## 2019-01-05 ENCOUNTER — Other Ambulatory Visit: Payer: Self-pay

## 2019-01-05 DIAGNOSIS — Z20828 Contact with and (suspected) exposure to other viral communicable diseases: Secondary | ICD-10-CM | POA: Diagnosis not present

## 2019-01-05 DIAGNOSIS — Z20822 Contact with and (suspected) exposure to covid-19: Secondary | ICD-10-CM

## 2019-01-07 LAB — NOVEL CORONAVIRUS, NAA: SARS-CoV-2, NAA: NOT DETECTED

## 2019-01-18 ENCOUNTER — Other Ambulatory Visit: Payer: Self-pay | Admitting: Internal Medicine

## 2019-01-18 DIAGNOSIS — J309 Allergic rhinitis, unspecified: Secondary | ICD-10-CM

## 2019-02-17 ENCOUNTER — Other Ambulatory Visit: Payer: Self-pay | Admitting: Internal Medicine

## 2019-02-17 DIAGNOSIS — J309 Allergic rhinitis, unspecified: Secondary | ICD-10-CM

## 2019-03-25 ENCOUNTER — Encounter: Payer: Self-pay | Admitting: Internal Medicine

## 2019-06-22 ENCOUNTER — Other Ambulatory Visit: Payer: Self-pay | Admitting: Physician Assistant

## 2019-06-22 DIAGNOSIS — E039 Hypothyroidism, unspecified: Secondary | ICD-10-CM

## 2019-07-15 ENCOUNTER — Ambulatory Visit
Admission: RE | Admit: 2019-07-15 | Discharge: 2019-07-15 | Disposition: A | Discharge status: Home / Self Care | Payer: No Typology Code available for payment source | Source: Ambulatory Visit | Attending: Obstetrics and Gynecology | Admitting: Obstetrics and Gynecology

## 2019-07-15 ENCOUNTER — Other Ambulatory Visit: Payer: Self-pay | Admitting: Obstetrics and Gynecology

## 2019-07-15 DIAGNOSIS — R7611 Nonspecific reaction to tuberculin skin test without active tuberculosis: Secondary | ICD-10-CM

## 2019-07-20 ENCOUNTER — Encounter: Payer: Self-pay | Admitting: Internal Medicine

## 2019-07-20 DIAGNOSIS — E039 Hypothyroidism, unspecified: Secondary | ICD-10-CM

## 2019-07-21 ENCOUNTER — Other Ambulatory Visit: Payer: Self-pay

## 2019-07-21 DIAGNOSIS — E039 Hypothyroidism, unspecified: Secondary | ICD-10-CM

## 2019-07-21 MED ORDER — SYNTHROID 50 MCG PO TABS: 50.0000 ug | ORAL_TABLET | Freq: Every day | ORAL | 0 refills | Status: DC

## 2019-08-14 ENCOUNTER — Encounter: Payer: Self-pay | Admitting: Internal Medicine

## 2019-08-14 ENCOUNTER — Ambulatory Visit: Payer: BC Managed Care – PPO | Attending: Internal Medicine | Admitting: Internal Medicine

## 2019-08-14 ENCOUNTER — Other Ambulatory Visit: Payer: Self-pay

## 2019-08-14 VITALS — BP 130/84 | HR 52 | Resp 16 | Wt 257.4 lb

## 2019-08-14 DIAGNOSIS — Z72821 Inadequate sleep hygiene: Secondary | ICD-10-CM

## 2019-08-14 DIAGNOSIS — Z6841 Body Mass Index (BMI) 40.0 and over, adult: Secondary | ICD-10-CM

## 2019-08-14 DIAGNOSIS — Z227 Latent tuberculosis: Secondary | ICD-10-CM | POA: Diagnosis not present

## 2019-08-14 DIAGNOSIS — E039 Hypothyroidism, unspecified: Secondary | ICD-10-CM | POA: Diagnosis not present

## 2019-08-14 DIAGNOSIS — Z23 Encounter for immunization: Secondary | ICD-10-CM

## 2019-08-14 DIAGNOSIS — R030 Elevated blood-pressure reading, without diagnosis of hypertension: Secondary | ICD-10-CM

## 2019-08-14 MED ORDER — SYNTHROID 50 MCG PO TABS: 50.0000 ug | ORAL_TABLET | Freq: Every day | ORAL | 11 refills | Status: DC

## 2019-08-14 NOTE — Progress Notes (Signed)
Patient ID: Sarah Hart, female    DOB: 01-16-1996  MRN: [id number]  CC: Hypothyroidism   Subjective: Sarah Hart is a 24 y.o. female who presents for chronic ds management Her concerns today include:  Hx of hypothyroid, fhx of breast CA in mother, obesity   Thyroid: Reports compliance with Synthroid.  Days when she feels a little tired.  But thinks this is due to poor sleep hygiene. She tries to get in bed around 10:30-11 p.m. she lays in bed looking at her cell phone.  Obesity: Working on trying to get her weight down.  She struggles with white carbs.  Feels she is not fill if she does not have rice, pasta, bread or other white carbs when she eats a meal.   She has started cooking a lot more.  Eating more veggies Not skipping meals the way she did when she was in undergrad.  When she skips she over eats. -started walking 3 days a wk.  Going to gym.   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  She had to have a TB skin test which she did last month and it was positive.  She subsequently had a negative chest x-ray.  She has spoken with the health department.  She wants to do the QuantiFERON gold test first to see if it is positive and then and only then when she consider prophylactic therapy with isoniazid.  She is not having any respiratory symptoms.  HM:  Complete COVID vaccine series.   Patient Active Problem List   Diagnosis Date Noted  . Family history of breast cancer in mother 12/20/2017  . Irregular menses 07/23/2017  . Class 3 severe obesity without serious comorbidity with body mass index (BMI) of 40.0 to 44.9 in adult ([place]) 07/23/2017  . Acquired hypothyroidism 02/08/2017  . Allergic rhinitis 02/08/2017     Current Outpatient Medications on File Prior to Visit  Medication Sig Dispense Refill  . fluticasone (FLONASE) 50 MCG/ACT nasal spray Place 1 spray into both nostrils daily. Please schedule PCP appt. 16 g 0  .  medroxyPROGESTERone (PROVERA) 10 MG tablet 1 tab PO daily for the first 10 days of each month (Patient not taking: Reported on 12/20/2017) 10 tablet 3   No current facility-administered medications on file prior to visit.    No Known Allergies  Social History   Socioeconomic History  . Marital status: Single    Spouse name: Not on file  . Number of children: Not on file  . Years of education: Not on file  . Highest education level: Not on file  Occupational History  . Not on file  Tobacco Use  . Smoking status: Never Smoker  . Smokeless tobacco: Never Used  Substance and Sexual Activity  . Alcohol use: Yes    Comment: once or twice a month  . Drug use: No  . Sexual activity: Never  Other Topics Concern  . Not on file  Social History Narrative  . Not on file   Social Determinants of Health   Financial Resource Strain:   . Difficulty of Paying Living Expenses:   Food Insecurity:   . Worried About Charity fundraiser in the Last Year:   . Arboriculturist in the Last Year:   Transportation Needs:   . Film/video editor (Medical):   Marland Kitchen Lack of Transportation (Non-Medical):   Physical Activity:   . Days of Exercise per Week:   .  Minutes of Exercise per Session:   Stress:   . Feeling of Stress :   Social Connections:   . Frequency of Communication with Friends and Family:   . Frequency of Social Gatherings with Friends and Family:   . Attends Religious Services:   . Active Member of Clubs or Organizations:   . Attends Archivist Meetings:   Marland Kitchen Marital Status:   Intimate Partner Violence:   . Fear of Current or Ex-Partner:   . Emotionally Abused:   Marland Kitchen Physically Abused:   . Sexually Abused:     Family History  Problem Relation Age of Onset  . Hypertension Mother   . Breast cancer Mother        Mother was dx in 2009  . Hypertension Father   . Multiple myeloma Father 31  . Asthma Brother   . Pancreatic cancer Maternal Grandmother   . Prostate cancer  Maternal Grandfather     No past surgical history on file.  ROS: Review of Systems Negative except as stated above  PHYSICAL EXAM: BP (!) 132/88   Pulse 52   Resp 16   Wt (!) 257 lb 6.4 oz (116.8 kg)   SpO2 98%   BMI 42.83 kg/m   Wt Readings from Last 3 Encounters:  08/14/19 (!) 257 lb 6.4 oz (116.8 kg)  08/21/18 256 lb 3.2 oz (116.2 kg)  12/20/17 247 lb 12.8 oz (112.4 kg)  BP 130/84   Physical Exam  General appearance - alert, well appearing, obese African-American female and in no distress Mental status - normal mood, behavior, speech, dress, motor activity, and thought processes Neck - supple, no significant adenopathy Chest - clear to auscultation, no wheezes, rales or rhonchi, symmetric air entry Heart - normal rate, regular rhythm, normal S1, S2, no murmurs, rubs, clicks or gallops Extremities - peripheral pulses normal, no pedal edema, no clubbing or cyanosis  CMP Latest Ref Rng & Units 08/21/2018  Glucose 65 - 99 mg/dL 91  BUN 6 - 20 mg/dL 9  Creatinine 0.57 - 1.00 mg/dL 0.78  Sodium 134 - 144 mmol/L 141  Potassium 3.5 - 5.2 mmol/L 4.1  Chloride 96 - 106 mmol/L 104  CO2 20 - 29 mmol/L 22  Calcium 8.7 - 10.2 mg/dL 9.4   ASSESSMENT AND PLAN: 1. Acquired hypothyroidism Check TSH today.  Continue Synthroid. - SYNTHROID 50 MCG tablet; Take 1 tablet (50 mcg total) by mouth daily.  Dispense: 30 tablet; Refill: 11 - TSH  2. Class 3 severe obesity due to excess calories without serious comorbidity with body mass index (BMI) of 40.0 to 44.9 in adult [facility]) Dietary counseling given along with printed information. Commended her on starting an exercise program.  Encouraged her to try to get in some exercise at least 3 to 4 days a week for 30 minutes  3. Poor sleep hygiene Discussed good sleep hygiene.  Advised to get in bed around about the same time every night.  Avoid drinking caffeinated beverages within 4 hours of bedtime.  Avoid excessive alcohol consumption prior to  bedtime.  Once in bed she should turn off all lights and sounds and put her cell phone on vibrate.  If unable to fall asleep within 45 minutes of being in bed, she should get up and try to do something until she feels sleepy.  4. TB lung, latent Discussed diagnosis of latent TB.  Patient want to have the QuantiFERON gold test done before considering doing prophylactic therapy - QuantiFERON-TB Gold  Plus  5. Elevated blood-pressure reading without diagnosis of hypertension DASH diet discussed and encouraged.  Follow-up with clinical pharmacist in 6 weeks for repeat blood pressure check.  6. Need for Tdap vaccination Given today   Patient was given the opportunity to ask questions.  Patient verbalized understanding of the plan and was able to repeat key elements of the plan.   Orders Placed This Encounter  Procedures  . TSH  . QuantiFERON-TB Gold Plus     Requested Prescriptions   Signed Prescriptions Disp Refills  . SYNTHROID 50 MCG tablet 30 tablet 11    Sig: Take 1 tablet (50 mcg total) by mouth daily.    Return in about 6 months (around 02/14/2020) for 1.5 mth with [facility] for repeat BP check.  Karle Plumber, MD, FACP

## 2019-08-14 NOTE — Patient Instructions (Signed)

## 2019-08-17 ENCOUNTER — Other Ambulatory Visit: Payer: Self-pay | Admitting: Internal Medicine

## 2019-08-17 DIAGNOSIS — E039 Hypothyroidism, unspecified: Secondary | ICD-10-CM

## 2019-08-18 LAB — QUANTIFERON-TB GOLD PLUS
QuantiFERON Mitogen Value: >10 IU/mL
QuantiFERON Nil Value: 0 IU/mL
QuantiFERON TB1 Ag Value: 1.12 IU/mL
QuantiFERON TB2 Ag Value: 0.77 IU/mL
QuantiFERON-TB Gold Plus: POSITIVE — AB

## 2019-08-18 LAB — TSH: TSH: 3.61 u[IU]/mL (ref 0.450–4.500)

## 2019-08-20 ENCOUNTER — Encounter: Payer: Self-pay | Admitting: Internal Medicine

## 2019-09-28 ENCOUNTER — Encounter: Payer: Self-pay | Admitting: Pharmacist

## 2019-09-28 ENCOUNTER — Ambulatory Visit: Payer: BC Managed Care – PPO | Admitting: Pharmacist

## 2019-09-28 ENCOUNTER — Ambulatory Visit: Payer: BC Managed Care – PPO | Attending: Internal Medicine | Admitting: Pharmacist

## 2019-09-28 ENCOUNTER — Other Ambulatory Visit: Payer: Self-pay

## 2019-09-28 VITALS — BP 110/78 | HR 76

## 2019-09-28 DIAGNOSIS — I10 Essential (primary) hypertension: Secondary | ICD-10-CM | POA: Diagnosis not present

## 2019-09-28 NOTE — Progress Notes (Signed)
   S:    PCP: Dr. Laural Benes  Patient arrives in good spirits. Presents to the clinic for hypertension evaluation, counseling, and management.  Patient was referred and last seen by Primary Care Provider on 08/14/2019.  BP at that visit 130/84. BP in the clinic has ranged from 116-131/73-84 mmHg since 2019.    Medication adherence: she does not currently take antihypertensive medication.  Current BP Medications include:  None   Antihypertensives tried in the past include: None   Dietary habits include: reports eating grilled chicken, broccoli and sweet potatoes for supper; sandwich and fruit for lunch. Compliant with salt restriction; denies drinking excess caffeine. Exercise habits include: reports combination of walking/home exercise but only 2-3 days over the past two weeks.  Family / Social history:  - FHx: asthma, HTN  - Tobacco: never smoker  - Alcohol: denies use    O:  There were no vitals filed for this visit.  Home BP readings: none   Last 3 Office BP readings: BP Readings from Last 3 Encounters:  08/14/19 (!) 130/84  08/21/18 126/80  12/20/17 122/82    BMET    Component Value Date/Time   NA 141 08/21/2018 1547   K 4.1 08/21/2018 1547   CL 104 08/21/2018 1547   CO2 22 08/21/2018 1547   GLUCOSE 91 08/21/2018 1547   BUN 9 08/21/2018 1547   CREATININE 0.78 08/21/2018 1547   CALCIUM 9.4 08/21/2018 1547   GFRNONAA 107 08/21/2018 1547   GFRAA 124 08/21/2018 1547    Renal function: CrCl cannot be calculated (Patient's most recent lab result is older than the maximum 21 days allowed.).  Clinical ASCVD: No  The ASCVD Risk score Denman George DC Jr., et al., 2013) failed to calculate for the following reasons:   The 2013 ASCVD risk score is only valid for ages 16 to 59   A/P: Hypertension undiagnosed. BP today is great. BP trend since 2019 looks good overall. She does not take BP medication currently. Recommend to continue with lifestyle change. No need for medication at  this time.   -Continued lifestyle changes.  -Counseled on lifestyle modifications for blood pressure control including reduced dietary sodium, increased exercise, adequate sleep.  Results reviewed and written information provided.   Total time in face-to-face counseling 15 minutes.   F/U Clinic Visit with PCP.    Butch Penny, PharmD, CPP Clinical Pharmacist [facility] [phone]

## 2019-10-07 ENCOUNTER — Encounter: Payer: Self-pay | Admitting: Internal Medicine

## 2019-10-08 ENCOUNTER — Other Ambulatory Visit: Payer: Self-pay | Admitting: Family Medicine

## 2019-10-08 DIAGNOSIS — F419 Anxiety disorder, unspecified: Secondary | ICD-10-CM

## 2019-10-14 ENCOUNTER — Telehealth: Payer: Self-pay | Admitting: Internal Medicine

## 2019-10-14 NOTE — Telephone Encounter (Signed)
Copied from CRM [id number]. Topic: Referral - Status >> Oct 13, 2019  1:05 PM Crist Infante wrote: Reason for CRM: Leavy Cella, have you seen referral fo this pt?  It was set to you.

## 2019-10-16 ENCOUNTER — Ambulatory Visit: Payer: BC Managed Care – PPO | Attending: Internal Medicine | Admitting: Pharmacist

## 2019-10-16 ENCOUNTER — Other Ambulatory Visit: Payer: Self-pay

## 2019-10-16 DIAGNOSIS — Z23 Encounter for immunization: Secondary | ICD-10-CM

## 2019-10-16 NOTE — Progress Notes (Signed)
Patient presents for vaccination against influenza per orders of Dr. Johnson. Consent given. Counseling provided. No contraindications exists. Vaccine administered without incident.  ° °Luke Van Ausdall, PharmD, CPP °Clinical Pharmacist °[facility] °[phone] ° °

## 2019-10-22 DIAGNOSIS — Z20822 Contact with and (suspected) exposure to covid-19: Secondary | ICD-10-CM | POA: Diagnosis not present

## 2019-11-02 ENCOUNTER — Ambulatory Visit: Payer: BC Managed Care – PPO | Attending: Internal Medicine | Admitting: Licensed Clinical Social Worker

## 2019-11-02 ENCOUNTER — Other Ambulatory Visit: Payer: Self-pay

## 2019-11-02 DIAGNOSIS — F419 Anxiety disorder, unspecified: Secondary | ICD-10-CM

## 2019-11-04 ENCOUNTER — Encounter: Payer: Self-pay | Admitting: Internal Medicine

## 2019-11-04 MED ORDER — SERTRALINE HCL 50 MG PO TABS: ORAL_TABLET | ORAL | 0 refills | Status: DC

## 2019-11-04 MED ORDER — HYDROXYZINE HCL 25 MG PO TABS: 25.0000 mg | ORAL_TABLET | Freq: Two times a day (BID) | ORAL | 0 refills | Status: DC | PRN

## 2019-11-20 NOTE — BH Specialist Note (Signed)
Integrated Behavioral Health Initial Visit  MRN: [id number] Name: Sarah Hart  Number of Integrated Behavioral Health Clinician visits:: 1/6 Session Start time: 1:40 PM  Session End time: 1:55 PM Total time: 15  Type of Service: Integrated Behavioral Health- Individual Interpretor:No. Interpretor Name and Language: NA   SUBJECTIVE: Sarah Hart is a 24 y.o. female accompanied by self Patient was referred by Dr. Laural Benes for anxiety. Patient reports the following symptoms/concerns: Pt reports difficulty managing anxiety symptoms  Duration of problem: Approx 4 weeks; Severity of problem: moderate  OBJECTIVE: Mood: Anxious and Affect: Appropriate Risk of harm to self or others: No plan to harm self or others   ASSESSMENT: Patient currently experiencing difficulty managing symptoms of anxiety.   Patient may benefit from therapy and medication management.  PLAN: 1. Follow up with behavioral health clinician on : Contact LCSW with any additional behavioral health and/or resource needs 2. Behavioral recommendations: Utilize healthy coping skills 3. Referral(s): Integrated Behavioral Health Services (In Clinic) 4. "From scale of 1-10, how likely are you to follow plan?":   Bridgett Larsson, LCSW 11/20/2019 7:39 AM

## 2019-12-08 ENCOUNTER — Other Ambulatory Visit: Payer: Self-pay | Admitting: Internal Medicine

## 2019-12-30 ENCOUNTER — Ambulatory Visit: Payer: BC Managed Care – PPO | Admitting: Physician Assistant

## 2020-02-18 ENCOUNTER — Other Ambulatory Visit: Payer: Self-pay

## 2020-02-18 ENCOUNTER — Encounter: Payer: Self-pay | Admitting: Internal Medicine

## 2020-02-18 ENCOUNTER — Ambulatory Visit: Payer: BC Managed Care – PPO | Attending: Internal Medicine | Admitting: Internal Medicine

## 2020-02-18 VITALS — Ht 65.0 in

## 2020-02-18 DIAGNOSIS — E039 Hypothyroidism, unspecified: Secondary | ICD-10-CM

## 2020-02-18 DIAGNOSIS — R41840 Attention and concentration deficit: Secondary | ICD-10-CM | POA: Diagnosis not present

## 2020-02-18 DIAGNOSIS — F418 Other specified anxiety disorders: Secondary | ICD-10-CM

## 2020-02-18 MED ORDER — BUSPIRONE HCL 5 MG PO TABS: 5.0000 mg | ORAL_TABLET | Freq: Two times a day (BID) | ORAL | 0 refills | Status: DC

## 2020-02-18 NOTE — Progress Notes (Signed)
Pt states she took the zoloft for 4 weeks pt states she doesn't think its happening in regards to her anxiety symptoms

## 2020-02-18 NOTE — Progress Notes (Signed)
Virtual Visit via Telephone Note  I connected with Sarah Hart on 02/18/20 at 10:45 a.m by telephone and verified that I am speaking with the correct person using two identifiers.  Location: Patient: home Provider: office   I discussed the limitations, risks, security and privacy concerns of performing an evaluation and management service by telephone and the availability of in person appointments. I also discussed with the patient that there may be a patient responsible charge related to this service. The patient expressed understanding and agreed to proceed.   History of Present Illness: Hx of hypothyroid, fhx of breast CA in mother, obesity, anxiety  Pt had contacted me in 10/2019 to report anxiety symptoms and requested med Started on Zoloft and Hydroxyzine Took the Zoloft fot 4 wks and did not feel it helped.  She took the hydroxyzine when needed but would only take half a tablet because it causes some drowsiness for her.  She does not feel that she needs to be on medication every day for the anxiety.  Her anxiety is situational and occurs when she has to present or speak publicly or in a situation where she is being observed.  Symptoms during these times include heart racing and shakiness.  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  On days when she has to do her internship she takes half a tablet of the hydroxyzine but does not feel that that controls her anxiety symptoms well. Spoke with LCSW and found it helpful.  She has reached out to a therapist and waiting for appt.  Feels she may be having symptoms of ADHD.  Her older brother was recently diagnosed and she feels she is having similar symptoms.  Has problems starting task and "sticking with things or be consistent.  If something does not bring instant gratification, I get bored."  Tends to procrastinate.    Hypothyroid:  Compliant with Levothyroxine  HM:  Due for COVID booster.  She plans to get  it  Outpatient Encounter Medications as of 02/18/2020  Medication Sig  . busPIRone (BUSPAR) 5 MG tablet Take 1 tablet (5 mg total) by mouth 2 (two) times daily.  . fluticasone (FLONASE) 50 MCG/ACT nasal spray Place 1 spray into both nostrils daily. Please schedule PCP appt.  . hydrOXYzine (ATARAX/VISTARIL) 25 MG tablet Take 1 tablet (25 mg total) by mouth 2 (two) times daily as needed for anxiety.  . medroxyPROGESTERone (PROVERA) 10 MG tablet 1 tab PO daily for the first 10 days of each month (Patient not taking: Reported on 12/20/2017)  . sertraline (ZOLOFT) 50 MG tablet TAKE 1/2 TABLET BY MOUTH DAILY FOR 2 TO 3 WEEKS THEN TAKE 1 TABLET BY MOUTH DAILY (Patient not taking: Reported on 02/18/2020)  . SYNTHROID 50 MCG tablet Take 1 tablet (50 mcg total) by mouth daily.   No facility-administered encounter medications on file as of 02/18/2020.      Observations/Objective: Depression screen [facility] 2/9 02/18/2020 08/14/2019 08/21/2018  Decreased Interest 1 1 0  Down, Depressed, Hopeless 0 1 0  PHQ - 2 Score 1 2 0  Altered sleeping - 0 0  Tired, decreased energy - 2 1  Change in appetite - 0 1  Feeling bad or failure about yourself  - 0 0  Trouble concentrating - 0 0  Moving slowly or fidgety/restless - 0 0  Suicidal thoughts - 0 0  PHQ-9 Score - 4 2   GAD 7 : Generalized Anxiety Score 02/18/2020 08/14/2019 08/21/2018  12/20/2017  Nervous, Anxious, on Edge 1 1 1  (No Data)  Control/stop worrying 2 1 0 (No Data)  Worry too much - different things 1 1 1  (No Data)  Trouble relaxing 0 0 0 (No Data)  Restless 0 0 0 (No Data)  Easily annoyed or irritable 0 0 0 (No Data)  Afraid - awful might happen 0 0 0 (No Data)  Total GAD 7 Score 4 3 2  -      Assessment and Plan: 1. Performance anxiety I recommend trying her on a low-dose of clonazepam to use as needed when she anticipates situations that would cause her to feel anxious.  Patient declined stating that she does not want to be on benzodiazepines.  She is  agreeable to trying BuSpar.  I informed her that BuSpar is meant to be taken several times a day but we can try having her use it only when needed even though I will write the prescription as twice daily dosing.  She is agreeable to this. - busPIRone (BUSPAR) 5 MG tablet; Take 1 tablet (5 mg total) by mouth 2 (two) times daily.  Dispense: 60 tablet; Refill: 0 - Ambulatory referral to Psychiatry  2. Attention deficit Recommend referral to psychiatry for formal evaluation and diagnosis. - Ambulatory referral to Psychiatry  3. Acquired hypothyroidism Continue levothyroxine. - TSH; Future   Follow Up Instructions: 4 mths   I discussed the assessment and treatment plan with the patient. The patient was provided an opportunity to ask questions and all were answered. The patient agreed with the plan and demonstrated an understanding of the instructions.   The patient was advised to call back or seek an in-person evaluation if the symptoms worsen or if the condition fails to improve as anticipated.  I provided 16 minutes of non-face-to-face time during this encounter.   , MD

## 2020-02-26 DIAGNOSIS — Z23 Encounter for immunization: Secondary | ICD-10-CM | POA: Diagnosis not present

## 2020-04-18 ENCOUNTER — Telehealth (INDEPENDENT_AMBULATORY_CARE_PROVIDER_SITE_OTHER): Payer: BC Managed Care – PPO | Admitting: Psychiatry

## 2020-04-18 ENCOUNTER — Encounter (HOSPITAL_COMMUNITY): Payer: Self-pay | Admitting: Psychiatry

## 2020-04-18 DIAGNOSIS — F341 Dysthymic disorder: Secondary | ICD-10-CM | POA: Diagnosis not present

## 2020-04-18 DIAGNOSIS — F4010 Social phobia, unspecified: Secondary | ICD-10-CM

## 2020-04-18 DIAGNOSIS — F418 Other specified anxiety disorders: Secondary | ICD-10-CM | POA: Diagnosis not present

## 2020-04-18 MED ORDER — BUPROPION HCL ER (SR) 100 MG PO TB12
100.0000 mg | ORAL_TABLET | Freq: Every day | ORAL | 0 refills | Status: DC
Start: 2020-04-18 — End: 2020-05-17

## 2020-04-18 MED ORDER — BUSPIRONE HCL 5 MG PO TABS: 5.0000 mg | ORAL_TABLET | Freq: Two times a day (BID) | ORAL | 0 refills | Status: DC

## 2020-04-18 NOTE — Progress Notes (Signed)
Psychiatric Initial Adult Assessment   Patient Identification: Sarah Hart MRN:  [id number] Date of Evaluation:  04/18/2020 Referral Source: primary care Chief Complaint:  establish care, anxiety, inattention Visit Diagnosis:    ICD-10-CM   1. 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 Dysthymia  F34.1    Virtual Visit via Video Note  I connected with [name] on 04/18/20 at  2:00 PM EDT by a video enabled telemedicine application and verified that I am speaking with the correct person using two identifiers.  Location: Patient: home Provider: home office   I discussed the limitations of evaluation and management by telemedicine and the availability of in person appointments. The patient expressed understanding and agreed to proceed.      I discussed the assessment and treatment plan with the patient. The patient was provided an opportunity to ask questions and all were answered. The patient agreed with the plan and demonstrated an understanding of the instructions.   The patient was advised to call back or seek an in-person evaluation if the symptoms worsen or if the condition fails to improve as anticipated.  I provided 40 minutes of non-face-to-face time during this encounter, including chart review, compiling and documentation.    History of Present Illness: Patient is a 25 years old, single African-American female referred by primary care physician to establish care for possible anxiety and inattention  Patient has suffered from anxiety especially social anxiety and performance anxiety she has been on sertraline but it did not help her.  She has been taking hydroxyzine for performance it does make her somewhat sleepy but it helps her performance if she takes a half a dose.  She takes BuSpar recently started that has helped anxiety and excessive worries in general.  She still endorses worries but not excessive  as before  Her complaint remains regarding procrastination unable to finish task at time difficulty focusing and struggling with complete sustained task.  She is graduating in general she is having reasonable grades and also in high school she had good grades.  She feels because of a motivation or not able to finish things on time she is less productive  She also endorses feeling down at times low-grade depression decreased motivation or energy but not crying spells or hopelessness or suicidal thoughts  Does not endorse psychotic symptoms, manic symptoms or panic attacks  Denies using drugs or alcohol     Aggravating factor: performance anxiety, Modifying factor: family, mom, friends Duration since young age  No prior psych admission or suicide attempt   Past Psychiatric History: anxiety  Previous Psychotropic Medications: Yes   Substance Abuse History in the last 12 months:  No.  Consequences of Substance Abuse: NA  Past Medical History:  Past Medical History:  Diagnosis Date  . Hypothyroidism    History reviewed. No pertinent surgical history.  Family Psychiatric History: denies  Family History:  Family History  Problem Relation Age of Onset  . Hypertension Mother   . Breast cancer Mother        Mother was dx in 2009  . Hypertension Father   . Multiple myeloma Father 64  . Asthma Brother   . Pancreatic cancer Maternal Grandmother   . Prostate cancer Maternal Grandfather     Social History:   Social History   Socioeconomic History  . Marital status: Single    Spouse name: Not on file  . Number of children: Not  on file  . Years of education: Not on file  . Highest education level: Not on file  Occupational History  . Not on file  Tobacco Use  . Smoking status: Never Smoker  . Smokeless tobacco: Never Used  Substance and Sexual Activity  . Alcohol use: Yes    Comment: once or twice a month  . Drug use: No  . Sexual activity: Never  Other Topics  Concern  . Not on file  Social History Narrative  . Not on file   Social Determinants of Health   Financial Resource Strain: Not on file  Food Insecurity: Not on file  Transportation Needs: Not on file  Physical Activity: Not on file  Stress: Not on file  Social Connections: Not on file    Additional Social History: grew up with mostly mom, elder sibling brothers, no trauma Had difficult around middle school with anxiety, did well in grades in high school  Allergies:  No Known Allergies  Metabolic Disorder Labs: No results found for: HGBA1C, MPG No results found for: PROLACTIN No results found for: CHOL, TRIG, HDL, CHOLHDL, VLDL, LDLCALC Lab Results  Component Value Date   TSH 3.610 08/14/2019    Therapeutic Level Labs: No results found for: LITHIUM No results found for: CBMZ No results found for: VALPROATE  Current Medications: Current Outpatient Medications  Medication Sig Dispense Refill  . buPROPion (WELLBUTRIN SR) 100 MG 12 hr tablet Take 1 tablet (100 mg total) by mouth daily. 30 tablet 0  . busPIRone (BUSPAR) 5 MG tablet Take 1 tablet (5 mg total) by mouth 2 (two) times daily. 60 tablet 0  . fluticasone (FLONASE) 50 MCG/ACT nasal spray Place 1 spray into both nostrils daily. Please schedule PCP appt. 16 g 0  . hydrOXYzine (ATARAX/VISTARIL) 25 MG tablet Take 1 tablet (25 mg total) by mouth 2 (two) times daily as needed for anxiety. 60 tablet 0  . medroxyPROGESTERone (PROVERA) 10 MG tablet 1 tab PO daily for the first 10 days of each month (Patient not taking: Reported on 12/20/2017) 10 tablet 3  . SYNTHROID 50 MCG tablet Take 1 tablet (50 mcg total) by mouth daily. 30 tablet 11   No current facility-administered medications for this visit.      Psychiatric Specialty Exam: Review of Systems  Cardiovascular: Negative for chest pain.  Psychiatric/Behavioral: Positive for decreased concentration. Negative for agitation and self-injury.    There were no vitals  taken for this visit.There is no height or weight on file to calculate BMI.  General Appearance: Casual  Eye Contact:  Fair  Speech:  Normal Rate  Volume:  Normal  Mood:  somewhat subdued  Affect:  Congruent  Thought Process:  Goal Directed  Orientation:  Full (Time, Place, and Person)  Thought Content:  Rumination  Suicidal Thoughts:  No  Homicidal Thoughts:  No  Memory:  Immediate;   Fair Recent;   Fair  Judgement:  Fair  Insight:  Fair  Psychomotor Activity:  Decreased  Concentration:  Concentration: Fair and Attention Span: Fair  Recall:  [employer] of Knowledge:Good  Language: Good  Akathisia:  No  Handed:    AIMS (if indicated):  not done  Assets:  Desire for Improvement Financial Resources/Insurance Housing Social Support  ADL's:  Intact  Cognition: WNL  Sleep:  Fair   Screenings: GAD-7   Flowsheet Row Telemedicine from 02/18/2020 in [place] Office Visit from 08/14/2019 in [place]  Visit from 08/21/2018 in [place] Office Visit from 11/08/2017 in [place] Office Visit from 02/08/2017 in [place]  Total GAD-7 Score _0 0    PHQ2-9   [place] from 02/18/2020 in [place] Office Visit from 08/14/2019 in [place] Office Visit from 08/21/2018 in [place] Office Visit from 11/08/2017 in [place] Office Visit from 02/08/2017 in [place]  PHQ-2 Total Score 1 2 0 2 0  PHQ-9 Total Score -- _1 --      Assessment and Plan: as follows  Social anxiety disorder; somewhat better on the BuSpar continue once or twice a day Performance anxiety Vistaril does help she can take half a tablet discussed distraction also she is planning to  start therapy  ADHD; 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

## 2020-05-04 DIAGNOSIS — F341 Dysthymic disorder: Secondary | ICD-10-CM | POA: Diagnosis not present

## 2020-05-04 DIAGNOSIS — F411 Generalized anxiety disorder: Secondary | ICD-10-CM | POA: Diagnosis not present

## 2020-05-12 DIAGNOSIS — F411 Generalized anxiety disorder: Secondary | ICD-10-CM | POA: Diagnosis not present

## 2020-05-12 DIAGNOSIS — F341 Dysthymic disorder: Secondary | ICD-10-CM | POA: Diagnosis not present

## 2020-05-17 ENCOUNTER — Other Ambulatory Visit (HOSPITAL_COMMUNITY): Payer: Self-pay | Admitting: Psychiatry

## 2020-05-25 ENCOUNTER — Encounter (HOSPITAL_COMMUNITY): Payer: Self-pay | Admitting: Psychiatry

## 2020-05-25 ENCOUNTER — Telehealth (INDEPENDENT_AMBULATORY_CARE_PROVIDER_SITE_OTHER): Payer: BC Managed Care – PPO | Admitting: Psychiatry

## 2020-05-25 DIAGNOSIS — F418 Other specified anxiety disorders: Secondary | ICD-10-CM | POA: Diagnosis not present

## 2020-05-25 DIAGNOSIS — F4010 Social phobia, unspecified: Secondary | ICD-10-CM

## 2020-05-25 DIAGNOSIS — F341 Dysthymic disorder: Secondary | ICD-10-CM

## 2020-05-25 MED ORDER — BUPROPION HCL ER (SR) 150 MG PO TB12: 150.0000 mg | ORAL_TABLET | Freq: Every day | ORAL | 0 refills | Status: DC

## 2020-05-25 NOTE — Progress Notes (Signed)
[place] Follow up visit   Patient Identification: Sarah Hart MRN:  [id number] Date of Evaluation:  05/25/2020 Referral Source: primary care Chief Complaint:   follow up depression, anxiety  Visit Diagnosis:    ICD-10-CM   1. Dysthymia  F34.1   2. Social anxiety disorder  F40.10   3. Performance anxiety  F41.8    Virtual Visit via Video Note  I connected with [name] on 05/25/20 at  2:00 PM EDT by a video enabled telemedicine application and verified that I am speaking with the correct person using two identifiers.  Location: Patient: home Provider: home office   I discussed the limitations of evaluation and management by telemedicine and the availability of in person appointments. The patient expressed understanding and agreed to proceed.   I discussed the assessment and treatment plan with the patient. The patient was provided an opportunity to ask questions and all were answered. The patient agreed with the plan and demonstrated an understanding of the instructions.   The patient was advised to call back or seek an in-person evaluation if the symptoms worsen or if the condition fails to improve as anticipated.  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  Patient has suffered from anxiety especially social anxiety and performance anxiety she takes buspar that has helped  Last visit added wellbutrin for depression and inattention She sees [place] at [facility] for therapy   Some improvement with wellbutrin,   Does not endorse psychotic symptoms, manic symptoms or panic attacks  Denies using drugs or alcohol  Aggravating  factor: performance anxiety, Modifying factor:family , friends  Duration since young age  No prior psych admission or suicide attempt   Past Psychiatric History: anxiety  Previous Psychotropic Medications: Yes    Past Medical History:  Past Medical History:  Diagnosis Date  . Hypothyroidism    History reviewed. No pertinent surgical history.  Family Psychiatric History: denies  Family History:  Family History  Problem Relation Age of Onset  . Hypertension Mother   . Breast cancer Mother        Mother was dx in 2009  . Hypertension Father   . Multiple myeloma Father 39  . Asthma Brother   . Pancreatic cancer Maternal Grandmother   . Prostate cancer Maternal Grandfather     Social History:   Social History   Socioeconomic History  . Marital status: Single    Spouse name: Not on file  . Number of children: Not on file  . Years of education: Not on file  . Highest education level: Not on file  Occupational History  . Not on file  Tobacco Use  . Smoking status: Never Smoker  . Smokeless tobacco: Never Used  Substance and Sexual Activity  . Alcohol use: Yes    Comment: once or twice a month  . Drug use: No  . Sexual activity: Never  Other Topics Concern  . Not on file  Social History Narrative  . Not on file   Social Determinants of Health   Financial Resource Strain: Not on file  Food Insecurity: Not on file  Transportation Needs: Not on file  Physical Activity: Not on  file  Stress: Not on file  Social Connections: Not on file      Allergies:  No Known Allergies  Metabolic Disorder Labs: No results found for: HGBA1C, MPG No results found for: PROLACTIN No results found for: CHOL, TRIG, HDL, CHOLHDL, VLDL, LDLCALC Lab Results  Component Value Date   TSH 3.610 08/14/2019    Therapeutic Level Labs: No results found for: LITHIUM No results found for: CBMZ No results found for: VALPROATE  Current Medications: Current Outpatient Medications   Medication Sig Dispense Refill  . buPROPion (WELLBUTRIN SR) 150 MG 12 hr tablet Take 1 tablet (150 mg total) by mouth daily. 30 tablet 0  . busPIRone (BUSPAR) 5 MG tablet Take 1 tablet (5 mg total) by mouth 2 (two) times daily. 60 tablet 0  . fluticasone (FLONASE) 50 MCG/ACT nasal spray Place 1 spray into both nostrils daily. Please schedule PCP appt. 16 g 0  . hydrOXYzine (ATARAX/VISTARIL) 25 MG tablet Take 1 tablet (25 mg total) by mouth 2 (two) times daily as needed for anxiety. 60 tablet 0  . medroxyPROGESTERone (PROVERA) 10 MG tablet 1 tab PO daily for the first 10 days of each month (Patient not taking: Reported on 12/20/2017) 10 tablet 3  . SYNTHROID 50 MCG tablet Take 1 tablet (50 mcg total) by mouth daily. 30 tablet 11   No current facility-administered medications for this visit.      Psychiatric Specialty Exam: Review of Systems  Cardiovascular: Negative for chest pain.  Psychiatric/Behavioral: Negative for agitation and self-injury.    There were no vitals taken for this visit.There is no height or weight on file to calculate BMI.  General Appearance: Casual  Eye Contact:  Fair  Speech:  Normal Rate  Volume:  Normal  Mood:  somewhat subdued  Affect:  Congruent  Thought Process:  Goal Directed  Orientation:  Full (Time, Place, and Person)  Thought Content:  Rumination  Suicidal Thoughts:  No  Homicidal Thoughts:  No  Memory:  Immediate;   Fair Recent;   Fair  Judgement:  Fair  Insight:  Fair  Psychomotor Activity:  Decreased  Concentration:  Concentration: Fair and Attention Span: Fair  Recall:  [employer] of Knowledge:Good  Language: Good  Akathisia:  No  Handed:    AIMS (if indicated):  not done  Assets:  Desire for Improvement Financial Resources/Insurance Housing Social Support  ADL's:  Intact  Cognition: WNL  Sleep:  Fair   Screenings: GAD-7   Flowsheet Row Telemedicine from 02/18/2020 in [place] Office Visit from  08/14/2019 in [place] Office Visit from 08/21/2018 in [place] Office Visit from 11/08/2017 in [place] Office Visit from 02/08/2017 in [place]  Total GAD-7 Score 4 3 2 8  0    PHQ2-9   Flowsheet Row Video Visit from 04/18/2020 in [place] from 02/18/2020 in [place] Office Visit from 08/14/2019 in [place] Office Visit from 08/21/2018 in [place] Office Visit from 11/08/2017 in [place]  PHQ-2 Total Score 1 1 2  0 2  PHQ-9 Total Score -- -- 4 2 5     Flowsheet Row Video Visit from 05/25/2020 in [place] Video Visit from 04/18/2020 in [place]  C-SSRS RISK CATEGORY No Risk No Risk      Assessment and Plan: as follows  Social anxiety disorder; better with buspar, continue  ADHD; 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

## 2020-06-17 ENCOUNTER — Encounter: Payer: Self-pay | Admitting: Internal Medicine

## 2020-06-17 ENCOUNTER — Ambulatory Visit: Payer: BC Managed Care – PPO | Attending: Internal Medicine | Admitting: Internal Medicine

## 2020-06-17 ENCOUNTER — Other Ambulatory Visit: Payer: Self-pay

## 2020-06-17 VITALS — BP 120/77 | HR 92 | Resp 16 | Wt 248.8 lb

## 2020-06-17 DIAGNOSIS — E039 Hypothyroidism, unspecified: Secondary | ICD-10-CM | POA: Diagnosis not present

## 2020-06-17 DIAGNOSIS — F418 Other specified anxiety disorders: Secondary | ICD-10-CM

## 2020-06-17 MED ORDER — SYNTHROID 50 MCG PO TABS
50.0000 ug | ORAL_TABLET | Freq: Every day | ORAL | 11 refills | Status: DC
Start: 2020-06-17 — End: 2020-09-15

## 2020-06-17 NOTE — Progress Notes (Signed)
Patient ID: Sarah Hart, female    DOB: [birth year]  MRN: [id number]  CC: Hypothyroidism   Subjective: Sarah Hart is a 25 y.o. female who presents for chronic ds management Her concerns today include:  Hx of hypothyroid, fhx of breast CA in mother, obesity, anxiety  Anx:  Better with Buspar. She has been taking it once a day. She has been seeing psychiatrist Dr. De Nurse.  Started on Wellbutrin which she also finds to be helpful..  Graduated with her MS from [employer].  This was a lot of her stress.  She will start working the end of this month.  She is working towards becoming a licensed Holiday representative.  Hypothyroid:  Compliant with Synthroid.  No hair loss, constipation or cold intolerance.  Obesity:  Down 11 lbs in past yr Eating more fruits and veggies.  Drinks mainly water Has to work on moving more.  Patient Active Problem List   Diagnosis Date Noted  . Family history of breast cancer in mother 12/20/2017  . Irregular menses 07/23/2017  . Class 3 severe obesity without serious comorbidity with body mass index (BMI) of 40.0 to 44.9 in adult ([place]) 07/23/2017  . Acquired hypothyroidism 02/08/2017  . Allergic rhinitis 02/08/2017     Current Outpatient Medications on File Prior to Visit  Medication Sig Dispense Refill  . buPROPion (WELLBUTRIN SR) 150 MG 12 hr tablet Take 1 tablet (150 mg total) by mouth daily. 30 tablet 0  . busPIRone (BUSPAR) 5 MG tablet Take 1 tablet (5 mg total) by mouth 2 (two) times daily. 60 tablet 0  . fluticasone (FLONASE) 50 MCG/ACT nasal spray Place 1 spray into both nostrils daily. Please schedule PCP appt. 16 g 0  . hydrOXYzine (ATARAX/VISTARIL) 25 MG tablet Take 1 tablet (25 mg total) by mouth 2 (two) times daily as needed for anxiety. 60 tablet 0  . medroxyPROGESTERone (PROVERA) 10 MG tablet 1 tab PO daily for the first 10 days of each month (Patient not taking: Reported on 12/20/2017) 10 tablet 3  . [DISCONTINUED]  sertraline (ZOLOFT) 50 MG tablet TAKE 1/2 TABLET BY MOUTH DAILY FOR 2 TO 3 WEEKS THEN TAKE 1 TABLET BY MOUTH DAILY (Patient not taking: Reported on 02/18/2020) 90 tablet 0   No current facility-administered medications on file prior to visit.    No Known Allergies  Social History   Socioeconomic History  . Marital status: Single    Spouse name: Not on file  . Number of children: Not on file  . Years of education: Not on file  . Highest education level: Not on file  Occupational History  . Not on file  Tobacco Use  . Smoking status: Never Smoker  . Smokeless tobacco: Never Used  Substance and Sexual Activity  . Alcohol use: Yes    Comment: once or twice a month  . Drug use: No  . Sexual activity: Never  Other Topics Concern  . Not on file  Social History Narrative  . Not on file   Social Determinants of Health   Financial Resource Strain: Not on file  Food Insecurity: Not on file  Transportation Needs: Not on file  Physical Activity: Not on file  Stress: Not on file  Social Connections: Not on file  Intimate Partner Violence: Not on file    Family History  Problem Relation Age of Onset  . Hypertension Mother   . Breast cancer Mother        Mother was dx  in 2009  . Hypertension Father   . Multiple myeloma Father 64  . Asthma Brother   . Pancreatic cancer Maternal Grandmother   . Prostate cancer Maternal Grandfather     No past surgical history on file.  ROS: Review of Systems Negative except as stated above  PHYSICAL EXAM: BP 120/77   Pulse 92   Resp 16   Wt 248 lb 12.8 oz (112.9 kg)   SpO2 98%   BMI 41.40 kg/m   Wt Readings from Last 3 Encounters:  06/17/20 248 lb 12.8 oz (112.9 kg)  08/14/19 (!) 257 lb 6.4 oz (116.8 kg)  08/21/18 256 lb 3.2 oz (116.2 kg)    Physical Exam  General appearance - alert, well appearing, and in no distress Mental status - normal mood, behavior, speech, dress, motor activity, and thought processes Neck - supple, no  significant adenopathy Chest - clear to auscultation, no wheezes, rales or rhonchi, symmetric air entry Heart - normal rate, regular rhythm, normal S1, S2, no murmurs, rubs, clicks or gallops Extremities - peripheral pulses normal, no pedal edema, no clubbing or cyanosis   CMP Latest Ref Rng & Units 08/21/2018  Glucose 65 - 99 mg/dL 91  BUN 6 - 20 mg/dL 9  Creatinine 0.57 - 1.00 mg/dL 0.78  Sodium 134 - 144 mmol/L 141  Potassium 3.5 - 5.2 mmol/L 4.1  Chloride 96 - 106 mmol/L 104  CO2 20 - 29 mmol/L 22  Calcium 8.7 - 10.2 mg/dL 9.4    ASSESSMENT AND PLAN: 1. Acquired hypothyroidism Continue levothyroxine. - SYNTHROID 50 MCG tablet; Take 1 tablet (50 mcg total) by mouth daily.  Dispense: 30 tablet; Refill: 11 - TSH  2. Morbid obesity with body mass index (BMI) of 40.0 or higher ([place]) -Commended her on weight loss so far.  Continue to encourage healthy eating habits.  Discussed the importance of meal planning.  Encouraged her to get moving with goal of getting in about 150 minutes/week of moderate intensity exercise. -meal planning stressed  3. Performance anxiety Improved on BuSpar.    Patient was given the opportunity to ask questions.  Patient verbalized understanding of the plan and was able to repeat key elements of the plan.   Orders Placed This Encounter  Procedures  . TSH     Requested Prescriptions   Signed Prescriptions Disp Refills  . SYNTHROID 50 MCG tablet 30 tablet 11    Sig: Take 1 tablet (50 mcg total) by mouth daily.    Return in about 4 months (around 10/17/2020).  Karle Plumber, MD, FACP

## 2020-06-17 NOTE — Patient Instructions (Signed)
Healthy Eating Following a healthy eating pattern may help you to achieve and maintain a healthy body weight, reduce the risk of chronic disease, and live a long and productive life. It is important to follow a healthy eating pattern at an appropriate calorie level for your body. Your nutritional needs should be met primarily through food by choosing a variety of nutrient-rich foods. What are tips for following this plan? Reading food labels  Read labels and choose the following: ? Reduced or low sodium. ? Juices with 100% fruit juice. ? Foods with low saturated fats and high polyunsaturated and monounsaturated fats. ? Foods with whole grains, such as whole wheat, cracked wheat, brown rice, and wild rice. ? Whole grains that are fortified with folic acid. This is recommended for women who are pregnant or who want to become pregnant.  Read labels and avoid the following: ? Foods with a lot of added sugars. These include foods that contain brown sugar, corn sweetener, corn syrup, dextrose, fructose, glucose, high-fructose corn syrup, honey, invert sugar, lactose, malt syrup, maltose, molasses, raw sugar, sucrose, trehalose, or turbinado sugar.  Do not eat more than the following amounts of added sugar per day:  6 teaspoons (25 g) for women.  9 teaspoons (38 g) for men. ? Foods that contain processed or refined starches and grains. ? Refined grain products, such as white flour, degermed cornmeal, white bread, and white rice. Shopping  Choose nutrient-rich snacks, such as vegetables, whole fruits, and nuts. Avoid high-calorie and high-sugar snacks, such as potato chips, fruit snacks, and candy.  Use oil-based dressings and spreads on foods instead of solid fats such as butter, stick margarine, or cream cheese.  Limit pre-made sauces, mixes, and "instant" products such as flavored rice, instant noodles, and ready-made pasta.  Try more plant-protein sources, such as tofu, tempeh, black beans,  edamame, lentils, nuts, and seeds.  Explore eating plans such as the Mediterranean diet or vegetarian diet. Cooking  Use oil to saut or stir-fry foods instead of solid fats such as butter, stick margarine, or lard.  Try baking, boiling, grilling, or broiling instead of frying.  Remove the fatty part of meats before cooking.  Steam vegetables in water or broth. Meal planning  At meals, imagine dividing your plate into fourths: ? One-half of your plate is fruits and vegetables. ? One-fourth of your plate is whole grains. ? One-fourth of your plate is protein, especially lean meats, poultry, eggs, tofu, beans, or nuts.  Include low-fat dairy as part of your daily diet.   Lifestyle  Choose healthy options in all settings, including home, work, school, restaurants, or stores.  Prepare your food safely: ? Wash your hands after handling raw meats. ? Keep food preparation surfaces clean by regularly washing with hot, soapy water. ? Keep raw meats separate from ready-to-eat foods, such as fruits and vegetables. ? Cook seafood, meat, poultry, and eggs to the recommended internal temperature. ? Store foods at safe temperatures. In general:  Keep cold foods at 7F (4.4C) or below.  Keep hot foods at 17F (60C) or above.  Keep your freezer at [facility] (-17.8C) or below.  Foods are no longer safe to eat when they have been between the temperatures of 40-17F (4.4-60C) for more than 2 hours. What foods should I eat? Fruits Aim to eat 2 cup-equivalents of fresh, canned (in natural juice), or frozen fruits each day. Examples of 1 cup-equivalent of fruit include 1 small apple, 8 large strawberries, 1 cup canned fruit,  cup dried fruit, or 1 cup 100% juice. Vegetables Aim to eat 2-3 cup-equivalents of fresh and frozen vegetables each day, including different varieties and colors. Examples of 1 cup-equivalent of vegetables include 2 medium carrots, 2 cups raw, leafy greens, 1 cup chopped  vegetable (raw or cooked), or 1 medium baked potato. Grains Aim to eat 6 ounce-equivalents of whole grains each day. Examples of 1 ounce-equivalent of grains include 1 slice of bread, 1 cup ready-to-eat cereal, 3 cups popcorn, or  cup cooked rice, pasta, or cereal. Meats and other proteins Aim to eat 5-6 ounce-equivalents of protein each day. Examples of 1 ounce-equivalent of protein include 1 egg, 1/2 cup nuts or seeds, or 1 tablespoon (16 g) peanut butter. A cut of meat or fish that is the size of a deck of cards is about 3-4 ounce-equivalents.  Of the protein you eat each week, try to have at least 8 ounces come from seafood. This includes salmon, trout, herring, and anchovies. Dairy Aim to eat 3 cup-equivalents of fat-free or low-fat dairy each day. Examples of 1 cup-equivalent of dairy include 1 cup (240 mL) milk, 8 ounces (250 g) yogurt, 1 ounces (44 g) natural cheese, or 1 cup (240 mL) fortified soy milk. Fats and oils  Aim for about 5 teaspoons (21 g) per day. Choose monounsaturated fats, such as canola and olive oils, avocados, peanut butter, and most nuts, or polyunsaturated fats, such as sunflower, corn, and soybean oils, walnuts, pine nuts, sesame seeds, sunflower seeds, and flaxseed. Beverages  Aim for six 8-oz glasses of water per day. Limit coffee to three to five 8-oz cups per day.  Limit caffeinated beverages that have added calories, such as soda and energy drinks.  Limit alcohol intake to no more than 1 drink a day for nonpregnant women and 2 drinks a day for men. One drink equals 12 oz of beer (355 mL), 5 oz of wine (148 mL), or 1 oz of hard liquor (44 mL). Seasoning and other foods  Avoid adding excess amounts of salt to your foods. Try flavoring foods with herbs and spices instead of salt.  Avoid adding sugar to foods.  Try using oil-based dressings, sauces, and spreads instead of solid fats. This information is based on general [place] nutrition guidelines. For more  information, visit choosemyplate.gov. Exact amounts may vary based on your nutrition needs. Summary  A healthy eating plan may help you to maintain a healthy weight, reduce the risk of chronic diseases, and stay active throughout your life.  Plan your meals. Make sure you eat the right portions of a variety of nutrient-rich foods.  Try baking, boiling, grilling, or broiling instead of frying.  Choose healthy options in all settings, including home, work, school, restaurants, or stores. This information is not intended to replace advice given to you by your health care provider. Make sure you discuss any questions you have with your health care provider. Document Revised: 04/15/2017 Document Reviewed: 04/15/2017 Elsevier Patient Education  2021 Elsevier Inc.  

## 2020-06-18 LAB — TSH: TSH: 3.94 u[IU]/mL (ref 0.450–4.500)

## 2020-07-21 ENCOUNTER — Other Ambulatory Visit (HOSPITAL_COMMUNITY): Payer: Self-pay

## 2020-07-21 DIAGNOSIS — F418 Other specified anxiety disorders: Secondary | ICD-10-CM

## 2020-07-21 MED ORDER — BUSPIRONE HCL 5 MG PO TABS: 5.0000 mg | ORAL_TABLET | Freq: Two times a day (BID) | ORAL | 0 refills | Status: DC

## 2020-08-01 ENCOUNTER — Telehealth (INDEPENDENT_AMBULATORY_CARE_PROVIDER_SITE_OTHER): Payer: 59 | Admitting: Psychiatry

## 2020-08-01 ENCOUNTER — Encounter (HOSPITAL_COMMUNITY): Payer: Self-pay | Admitting: Psychiatry

## 2020-08-01 DIAGNOSIS — F418 Other specified anxiety disorders: Secondary | ICD-10-CM

## 2020-08-01 DIAGNOSIS — F341 Dysthymic disorder: Secondary | ICD-10-CM

## 2020-08-01 MED ORDER — BUPROPION HCL ER (SR) 150 MG PO TB12: 150.0000 mg | ORAL_TABLET | Freq: Every day | ORAL | 1 refills | Status: DC

## 2020-08-01 MED ORDER — BUSPIRONE HCL 5 MG PO TABS: 5.0000 mg | ORAL_TABLET | Freq: Two times a day (BID) | ORAL | 1 refills | Status: DC

## 2020-08-01 NOTE — Progress Notes (Signed)
[facility] Follow up visit   Patient Identification: Sarah Hart MRN:  [id number] Date of Evaluation:  08/01/2020 Referral Source: primary care Chief Complaint:   follow up depression, anxiety  Visit Diagnosis:    ICD-10-CM   1. Dysthymia  F34.1     2. Performance anxiety  F41.8 busPIRone (BUSPAR) 5 MG tablet      Virtual Visit via Video Note  I connected with [name] on 08/01/20 at  3:30 PM EDT by a video enabled telemedicine application and verified that I am speaking with the correct person using two identifiers.  Location: Patient: home Provider: home office   I discussed the limitations of evaluation and management by telemedicine and the availability of in person appointments. The patient expressed understanding and agreed to proceed.    I discussed the assessment and treatment plan with the patient. The patient was provided an opportunity to ask questions and all were answered. The patient agreed with the plan and demonstrated an understanding of the instructions.   The patient was advised to call back or seek an in-person evaluation if the symptoms worsen or if the condition fails to improve as anticipated.  I provided 12  minutes of non-face-to-face time during this encounter including review and documentatio    History of Present Illness: Patient is a 25 years old, single African-American female initially referred by primary care physician to establish care for possible anxiety and inattention She just graduated from [employer]	[place] to start with therapy related to inhome with kids, looking forward  Doing fair in regard to anxiety as well  Wellbutrin has helped derpession, inattention Patient has suffered from anxiety especially social anxie Buspar helps with anxiety  She sees [place] at [facility] for therapy   Denies using drugs or alcohol  Aggravating factor: performance anxiety, Modifying factor:family , friends  Duration  since young age  No prior psych admission or suicide attempt   Past Psychiatric History: anxiety  Previous Psychotropic Medications: Yes    Past Medical History:  Past Medical History:  Diagnosis Date  . Hypothyroidism    History reviewed. No pertinent surgical history.  Family Psychiatric History: denies  Family History:  Family History  Problem Relation Age of Onset  . Hypertension Mother   . Breast cancer Mother        Mother was dx in 2009  . Hypertension Father   . Multiple myeloma Father 6  . Asthma Brother   . Pancreatic cancer Maternal Grandmother   . Prostate cancer Maternal Grandfather     Social History:   Social History   Socioeconomic History  . Marital status: Single    Spouse name: Not on file  . Number of children: Not on file  . Years of education: Not on file  . Highest education level: Not on file  Occupational History  . Not on file  Tobacco Use  . Smoking status: Never  . Smokeless tobacco: Never  Substance and Sexual Activity  . Alcohol use: Yes    Comment: once or twice a month  . Drug use: No  . Sexual activity: Never  Other Topics Concern  . Not on file  Social History Narrative  . Not on file   Social Determinants of Health   Financial Resource Strain: Not on file  Food Insecurity: Not on file  Transportation Needs: Not on file  Physical Activity: Not on file  Stress: Not on file  Social Connections: Not on file  Allergies:  No Known Allergies  Metabolic Disorder Labs: No results found for: HGBA1C, MPG No results found for: PROLACTIN No results found for: CHOL, TRIG, HDL, CHOLHDL, VLDL, LDLCALC Lab Results  Component Value Date   TSH 3.940 06/17/2020    Therapeutic Level Labs: No results found for: LITHIUM No results found for: CBMZ No results found for: VALPROATE  Current Medications: Current Outpatient Medications  Medication Sig Dispense Refill  . buPROPion (WELLBUTRIN SR) 150 MG 12 hr tablet Take  1 tablet (150 mg total) by mouth daily. 30 tablet 1  . busPIRone (BUSPAR) 5 MG tablet Take 1 tablet (5 mg total) by mouth 2 (two) times daily. 60 tablet 1  . fluticasone (FLONASE) 50 MCG/ACT nasal spray Place 1 spray into both nostrils daily. Please schedule PCP appt. 16 g 0  . medroxyPROGESTERone (PROVERA) 10 MG tablet 1 tab PO daily for the first 10 days of each month (Patient not taking: Reported on 12/20/2017) 10 tablet 3  . SYNTHROID 50 MCG tablet Take 1 tablet (50 mcg total) by mouth daily. 30 tablet 11   No current facility-administered medications for this visit.      Psychiatric Specialty Exam: Review of Systems  Cardiovascular:  Negative for chest pain.  Psychiatric/Behavioral:  Negative for agitation and self-injury.    There were no vitals taken for this visit.There is no height or weight on file to calculate BMI.  General Appearance: Casual  Eye Contact:  Fair  Speech:  Normal Rate  Volume:  Normal  Mood: better  Affect:  Congruent  Thought Process:  Goal Directed  Orientation:  Full (Time, Place, and Person)  Thought Content:  Rumination  Suicidal Thoughts:  No  Homicidal Thoughts:  No  Memory:  Immediate;   Fair Recent;   Fair  Judgement:  Fair  Insight:  Fair  Psychomotor Activity:  Decreased  Concentration:  Concentration: Fair and Attention Span: Fair  Recall:  [employer] of Knowledge:Good  Language: Good  Akathisia:  No  Handed:    AIMS (if indicated):  not done  Assets:  Desire for Improvement Financial Resources/Insurance Housing Social Support  ADL's:  Intact  Cognition: WNL  Sleep:  Fair   Screenings: GAD-7    [facility] Office Visit from 06/17/2020 in [facility] from 02/18/2020 in [facility] Visit from 08/14/2019 in [facility] Office Visit from 08/21/2018 in [place] Visit from 11/08/2017 in [place]  Total GAD-7 Score _0 PHQ2-9    [facility] Office Visit from 06/17/2020 in [place] Video Visit from 04/18/2020 in [facility] from 02/18/2020 in [place] Office Visit from 08/14/2019 in [facility] Office Visit from 08/21/2018 in [facility]  PHQ-2 Total Score _1 0  PHQ-9 Total Score 6 -- -- 4 2      Flowsheet Row Video Visit from 05/25/2020 in [place] Video Visit from 04/18/2020 in [place] No Risk No Risk       Assessment and Plan: as follows Prior documentation reviewed Social anxiety disorder; doing fair continue BuSpar  ADHD; 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

## 2020-08-16 ENCOUNTER — Other Ambulatory Visit (HOSPITAL_COMMUNITY): Payer: Self-pay | Admitting: Psychiatry

## 2020-08-16 DIAGNOSIS — F418 Other specified anxiety disorders: Secondary | ICD-10-CM

## 2020-08-16 NOTE — Telephone Encounter (Signed)
Last refill 07/22/20.

## 2020-08-18 ENCOUNTER — Telehealth (HOSPITAL_COMMUNITY): Payer: Self-pay | Admitting: *Deleted

## 2020-08-18 NOTE — Telephone Encounter (Signed)
Pt called stated pharmacy needed PA approval Rx Bupropion 150 mg 12hr tab.

## 2020-08-19 NOTE — Telephone Encounter (Signed)
Medication management - Telephone call with patient 2 times this date and with her [employer] to verify patient does not need a prior authorization for her Bupropion SR 150 mg.  Spoke to pharmacist at [facility] who verified and is filling order as prescribed.  Patient informed medication now being filled without anything else needed and as a preferred medication under her plan.  Patient to call back if any other issues filling order.

## 2020-09-14 ENCOUNTER — Other Ambulatory Visit: Payer: Self-pay | Admitting: Internal Medicine

## 2020-09-14 DIAGNOSIS — E039 Hypothyroidism, unspecified: Secondary | ICD-10-CM

## 2020-09-27 ENCOUNTER — Other Ambulatory Visit: Payer: Self-pay

## 2020-09-27 ENCOUNTER — Encounter: Payer: Self-pay | Admitting: Internal Medicine

## 2020-09-27 MED ORDER — VALACYCLOVIR HCL 1 G PO TABS: 2000.0000 mg | ORAL_TABLET | Freq: Two times a day (BID) | ORAL | 1 refills | Status: DC

## 2020-10-20 ENCOUNTER — Encounter (HOSPITAL_COMMUNITY): Payer: Self-pay | Admitting: Psychiatry

## 2020-10-20 ENCOUNTER — Telehealth (INDEPENDENT_AMBULATORY_CARE_PROVIDER_SITE_OTHER): Payer: 59 | Admitting: Psychiatry

## 2020-10-20 DIAGNOSIS — F418 Other specified anxiety disorders: Secondary | ICD-10-CM

## 2020-10-20 DIAGNOSIS — F341 Dysthymic disorder: Secondary | ICD-10-CM

## 2020-10-20 MED ORDER — BUPROPION HCL ER (SR) 150 MG PO TB12: 150.0000 mg | ORAL_TABLET | Freq: Every day | ORAL | 1 refills | Status: DC

## 2020-10-20 MED ORDER — PROPRANOLOL HCL 10 MG PO TABS: 10.0000 mg | ORAL_TABLET | Freq: Three times a day (TID) | ORAL | 0 refills | Status: DC

## 2020-10-20 NOTE — Progress Notes (Signed)
Sarah Hart Follow up visit   Patient Identification: Sarah Hart MRN:  [id number] Date of Evaluation:  10/20/2020 Referral Source: primary care Chief Complaint:   follow up depression, anxiety  Visit Diagnosis:    ICD-10-CM   1. Dysthymia  F34.1     2. 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  Location: Patient: home Provider: office   I discussed the limitations of evaluation and management by telemedicine and the availability of in person appointments. The patient expressed understanding and agreed to proceed.  History of Present Illness:    Observations/Objective:   Assessment and Plan:   Follow Up Instructions:    I discussed the assessment and treatment plan with the patient. The patient was provided an opportunity to ask questions and all were answered. The patient agreed with the plan and demonstrated an understanding of the instructions.   The patient was advised to call back or seek an in-person evaluation if the symptoms worsen or if the condition fails to improve as anticipated.  I provided 10 minutes of non-face-to-face time during this encounter.   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 No pertinent surgical history.  Family Psychiatric History: denies  Family History:  Family History  Problem Relation Age of Onset   Hypertension Mother    Breast cancer Mother        Mother was dx in 2009   Hypertension Father    Multiple myeloma Father 35   Asthma Brother    Pancreatic cancer Maternal Grandmother    Prostate cancer Maternal Grandfather     Social History:   Social History   Socioeconomic History   Marital status: Single    Spouse name: Not on file   Number of children: Not on file   Years of education: Not on file   Highest education level: Not on file  Occupational History   Not on file  Tobacco Use   Smoking status: Never   Smokeless tobacco: Never  Substance and Sexual Activity   Alcohol use: Yes    Comment: once or twice a month   Drug use: No   Sexual activity: Never  Other Topics Concern   Not on file  Social History Narrative   Not on file   Social Determinants of Health   Financial Resource Strain: Not on file  Food Insecurity: Not on file  Transportation Needs: Not on file  Physical Activity: Not on file  Stress: Not on file  Social Connections: Not on file  Allergies:  No Known Allergies  Metabolic Disorder Labs: No results found for: HGBA1C, MPG No results found for: PROLACTIN No results found for: CHOL, TRIG, HDL, CHOLHDL, VLDL, LDLCALC Lab Results  Component Value Date   TSH 3.940 06/17/2020    Therapeutic Level Labs: No results found for: LITHIUM No results found for: CBMZ No results found for: VALPROATE  Current Medications: Current Outpatient Medications  Medication Sig Dispense Refill   propranolol (INDERAL) 10 MG tablet Take 1 tablet (10 mg total) by mouth 3  (three) times daily. Take half tablet 30 minutes before performance or anxiety performing situations 30 tablet 0   buPROPion (WELLBUTRIN SR) 150 MG 12 hr tablet Take 1 tablet (150 mg total) by mouth daily. 30 tablet 1   busPIRone (BUSPAR) 5 MG tablet TAKE 1 TABLET(5 MG) BY MOUTH TWICE DAILY 60 tablet 1   fluticasone (FLONASE) 50 MCG/ACT nasal spray Place 1 spray into both nostrils daily. Please schedule PCP appt. 16 g 0   medroxyPROGESTERone (PROVERA) 10 MG tablet 1 tab PO daily for the first 10 days of each month (Patient not taking: Reported on 12/20/2017) 10 tablet 3   SYNTHROID 50 MCG tablet TAKE 1 TABLET(50 MCG) BY MOUTH DAILY 90 tablet 2   valACYclovir (VALTREX) 1000 MG tablet Take 2 tablets (2,000 mg total) by mouth 2 (two) times daily. 4 tablet 1   No current facility-administered medications for this visit.      Psychiatric Specialty Exam: Review of Systems  Cardiovascular:  Negative for chest pain.  Psychiatric/Behavioral:  Negative for agitation and self-injury.    There were no vitals taken for this visit.There is no height or weight on file to calculate BMI.  General Appearance: Casual  Eye Contact:  Fair  Speech:  Normal Rate  Volume:  Normal  Mood: fair  Affect:  Congruent  Thought Process:  Goal Directed  Orientation:  Full (Time, Place, and Person)  Thought Content:  Rumination  Suicidal Thoughts:  No  Homicidal Thoughts:  No  Memory:  Immediate;   Fair Recent;   Fair  Judgement:  Fair  Insight:  Fair  Psychomotor Activity:  Decreased  Concentration:  Concentration: Fair and Attention Span: Fair  Recall:  [employer] of Knowledge:Good  Language: Good  Akathisia:  No  Handed:    AIMS (if indicated):  not done  Assets:  Desire for Improvement Financial Resources/Insurance Housing Social Support  ADL's:  Intact  Cognition: WNL  Sleep:  Fair   Screenings: GAD-7    Personnel officer Visit from 06/17/2020 in [place] from 02/18/2020 in [place] Visit from 08/14/2019 in [place] Office Visit from 08/21/2018 in [place] Office Visit from 11/08/2017 in [place]  Total GAD-7 Score _0 PHQ2-9    [place] Office Visit from 06/17/2020 in [place] Video Visit from 04/18/2020 in [place] from 02/18/2020 in [place] Office Visit from 08/14/2019 in [place] Office Visit from 08/21/2018 in [place]  PHQ-2 Total Score _1 0  PHQ-9 Total Score 6 -- -- 4 2      Flowsheet Row Video Visit from 10/20/2020 in [place]  Video Visit from 05/25/2020 in [place] Video Visit from 04/18/2020 in [place] No Risk No Risk No Risk       Assessment and Plan: as follows Prior documentation reviewed Social anxiety disorder; fair on buspar Performance anxiety: start inderal 25m prn ,  considering she still has that component of anxiety   ADHD; 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

## 2020-10-24 ENCOUNTER — Other Ambulatory Visit: Payer: Self-pay | Admitting: Internal Medicine

## 2020-10-24 MED ORDER — VALACYCLOVIR HCL 1 G PO TABS: 2000.0000 mg | ORAL_TABLET | Freq: Two times a day (BID) | ORAL | 1 refills | Status: DC

## 2020-10-28 ENCOUNTER — Other Ambulatory Visit (HOSPITAL_COMMUNITY): Payer: Self-pay | Admitting: Psychiatry

## 2020-12-19 ENCOUNTER — Encounter (HOSPITAL_COMMUNITY): Payer: Self-pay

## 2020-12-19 ENCOUNTER — Telehealth (HOSPITAL_COMMUNITY): Payer: 59 | Admitting: Psychiatry

## 2021-01-27 ENCOUNTER — Other Ambulatory Visit: Payer: Self-pay | Admitting: Internal Medicine

## 2021-04-11 ENCOUNTER — Telehealth (INDEPENDENT_AMBULATORY_CARE_PROVIDER_SITE_OTHER): Payer: 59 | Admitting: Psychiatry

## 2021-04-11 ENCOUNTER — Encounter (HOSPITAL_COMMUNITY): Payer: Self-pay | Admitting: Psychiatry

## 2021-04-11 DIAGNOSIS — F418 Other specified anxiety disorders: Secondary | ICD-10-CM | POA: Diagnosis not present

## 2021-04-11 DIAGNOSIS — R41840 Attention and concentration deficit: Secondary | ICD-10-CM | POA: Diagnosis not present

## 2021-04-11 DIAGNOSIS — F4010 Social phobia, unspecified: Secondary | ICD-10-CM

## 2021-04-11 DIAGNOSIS — F341 Dysthymic disorder: Secondary | ICD-10-CM

## 2021-04-11 MED ORDER — BUPROPION HCL ER (SR) 200 MG PO TB12
200.0000 mg | ORAL_TABLET | Freq: Every day | ORAL | 1 refills | Status: DC
Start: 2021-04-11 — End: 2021-06-13

## 2021-04-11 MED ORDER — BUSPIRONE HCL 5 MG PO TABS: ORAL_TABLET | ORAL | 1 refills | Status: DC

## 2021-04-11 NOTE — Progress Notes (Signed)
[facility] Follow up visit  ? ?Patient Identification: Sarah Hart ?MRN:  [id number] ?Date of Evaluation:  04/11/2021 ?Referral Source: primary care ?Chief Complaint:   follow up depression, anxiety ? ?Visit Diagnosis:  ?  ICD-10-CM   ?1. Inattention  R41.840   ?  ?2. Social anxiety disorder  F40.10   ?  ?3. Dysthymia  F34.1   ?  ?4. 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  ? ? ?Virtual Visit via Video Note ? ?I connected with Sarah Hart on 04/11/21 at  3:00 PM EDT by a video enabled telemedicine application and verified that I am speaking with the correct person using two identifiers. ? ?Location: ?Patient: home ?Provider: home office ?  ?I discussed the limitations of evaluation and management by telemedicine and the availability of in person appointments. The patient expressed understanding and agreed to proceed. ? ? ? ?  ?I discussed the assessment and treatment plan with the patient. The patient was provided an opportunity to ask questions and all were answered. The patient agreed with the plan and demonstrated an understanding of the instructions. ?  ?The patient was advised to call back or seek an in-person evaluation if the symptoms worsen or if the condition fails to improve as anticipated. ? ?I provided 15 minutes of non-face-to-face time during this encounter. ? ? ?History of Present Illness: Patient is a 26 years old, single African-American female initially referred by primary care physician to establish care for possible anxiety and inattention ?She just graduated from [employer]  ? 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 ?She sees [place] at [facility] for therapy ? ? ?Denies using drugs or alcohol ? ?Aggravating factor: performance anxiety, amotivation ?Modifying factor:family ,friends ? ?Duration since young age ? ?No prior psych admission  or suicide attempt ?Severity : feels if attention needs to be helped with meds ? ?Past Psychiatric History: anxiety ? ?Previous Psychotropic Medications: Yes  ? ? ?Past Medical History:  ?Past Medical History:  ?Diagnosis Date  ? Hypothyroidism   ? History reviewed. No pertinent surgical history. ? ?Family Psychiatric History: denies ? ?Family History:  ?Family History  ?Problem Relation Age of Onset  ? Hypertension Mother   ? Breast cancer Mother   ?     Mother was dx in 2009  ? Hypertension Father   ? Multiple myeloma Father 7  ? Asthma Brother   ? Pancreatic cancer Maternal Grandmother   ? Prostate cancer Maternal Grandfather   ? ? ?Social History:   ?Social History  ? ?Socioeconomic History  ? Marital status: Single  ?  Spouse name: Not on file  ? Number of children: Not on file  ? Years of education: Not on file  ? Highest education level: Not on file  ?Occupational History  ? Not on file  ?Tobacco Use  ? Smoking status: Never  ? Smokeless tobacco: Never  ?Substance and Sexual Activity  ? Alcohol use: Yes  ?  Comment: once or twice a month  ? Drug use: No  ? Sexual activity: Never  ?Other Topics Concern  ? Not on file  ?Social History Narrative  ? Not on file  ? ?Social Determinants of Health  ? ?Financial Resource Strain: Not on file  ?Food Insecurity: Not on file  ?Transportation Needs: Not on file  ?Physical Activity: Not on file  ?Stress: Not  on file  ?Social Connections: Not on file  ? ? ? ? ?Allergies:  No Known Allergies ? ?Metabolic Disorder Labs: ?No results found for: HGBA1C, MPG ?No results found for: PROLACTIN ?No results found for: CHOL, TRIG, HDL, CHOLHDL, VLDL, LDLCALC ?Lab Results  ?Component Value Date  ? TSH 3.940 06/17/2020  ? ? ?Therapeutic Level Labs: ?No results found for: LITHIUM ?No results found for: CBMZ ?No results found for: VALPROATE ? ?Current Medications: ?Current Outpatient Medications  ?Medication Sig Dispense Refill  ? buPROPion (WELLBUTRIN SR) 200 MG 12 hr tablet Take 1  tablet (200 mg total) by mouth daily. 30 tablet 1  ? busPIRone (BUSPAR) 5 MG tablet TAKE 1 TABLET(5 MG) BY MOUTH TWICE DAILY 60 tablet 1  ? fluticasone (FLONASE) 50 MCG/ACT nasal spray Place 1 spray into both nostrils daily. Please schedule PCP appt. 16 g 0  ? medroxyPROGESTERone (PROVERA) 10 MG tablet 1 tab PO daily for the first 10 days of each month (Patient not taking: Reported on 12/20/2017) 10 tablet 3  ? propranolol (INDERAL) 10 MG tablet Take 1 tablet (10 mg total) by mouth as needed. 30 tablet 0  ? SYNTHROID 50 MCG tablet TAKE 1 TABLET(50 MCG) BY MOUTH DAILY 90 tablet 2  ? valACYclovir (VALTREX) 1000 MG tablet TAKE 2 TABLETS(2000 MG) BY MOUTH TWICE DAILY 4 tablet 1  ? ?No current facility-administered medications for this visit.  ? ? ? ? ?Psychiatric Specialty Exam: ?Review of Systems  ?Cardiovascular:  Negative for chest pain.  ?Psychiatric/Behavioral:  Positive for decreased concentration. Negative for agitation and self-injury.    ?There were no vitals taken for this visit.There is no height or weight on file to calculate BMI.  ?General Appearance: Casual  ?Eye Contact:  Fair  ?Speech:  Normal Rate  ?Volume:  Normal  ?Mood: fair  ?Affect:  Congruent  ?Thought Process:  Goal Directed  ?Orientation:  Full (Time, Place, and Person)  ?Thought Content:  Rumination  ?Suicidal Thoughts:  No  ?Homicidal Thoughts:  No  ?Memory:  Immediate;   Fair ?Recent;   Fair  ?Judgement:  Fair  ?Insight:  Fair  ?Psychomotor Activity:  Decreased  ?Concentration:  Concentration: Fair and Attention Span: Fair  ?Recall:  Fair  ?[employer] [place]  ?Language: Good  ?Akathisia:  No  ?Handed:    ?AIMS (if indicated):  not done  ?Assets:  Desire for Improvement ?Financial Resources/Insurance ?Housing ?Social Support  ?ADL's:  Intact  ?Cognition: WNL  ?Sleep:  Fair  ? ?Screenings: ?GAD-7   ? ?[place] Office Visit from 06/17/2020 in [place] from 02/18/2020 in [place] Office Visit from 08/14/2019 in [place] Office Visit from 08/21/2018 in [place] Office Visit from 11/08/2017 in [place]  ?Total GAD-7 Score 1 4 3 2 8   ? ?  ? ?PHQ2-9   ? ?[place] Office Visit from 06/17/2020 in [place] Video Visit from 04/18/2020 in [place] from 02/18/2020 in [place] Office Visit from 08/14/2019 in [place] Office Visit from 08/21/2018 in [place]  ?PHQ-2 Total Score 3 1 1 2  0  ?PHQ-9 Total Score 6 -- -- 4 2  ? ?  ? ?Flowsheet Row Video Visit from 04/11/2021 in [place]  Video Visit from 10/20/2020 in [place] Video Visit from 05/25/2020 in [place]  ?C-SSRS RISK CATEGORY No Risk No Risk No Risk  ? ?  ? ? ?Assessment and Plan: as follows ? ? ?Prior documentation reviewed ? ?Social anxiety disorder; fair continue buspar ?Performance anxiety: takes inderal prn continue buspar ? 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 ?Depression; see above, continue wellbutrin ? ?Fu 222m ?NaMerian CapronMD ?3/28/20233:13 PM ?

## 2021-05-17 ENCOUNTER — Encounter: Payer: Self-pay | Admitting: Internal Medicine

## 2021-06-13 ENCOUNTER — Other Ambulatory Visit (HOSPITAL_COMMUNITY): Payer: Self-pay

## 2021-06-13 MED ORDER — BUPROPION HCL ER (SR) 200 MG PO TB12: 200.0000 mg | ORAL_TABLET | Freq: Every day | ORAL | 0 refills | Status: DC

## 2021-06-19 ENCOUNTER — Encounter: Payer: Self-pay | Admitting: Internal Medicine

## 2021-06-19 ENCOUNTER — Ambulatory Visit: Payer: 59 | Attending: Internal Medicine | Admitting: Internal Medicine

## 2021-06-19 VITALS — BP 110/80 | HR 84 | Temp 98.3°F | Resp 16 | Ht 65.0 in | Wt 232.4 lb

## 2021-06-19 DIAGNOSIS — F418 Other specified anxiety disorders: Secondary | ICD-10-CM

## 2021-06-19 DIAGNOSIS — E669 Obesity, unspecified: Secondary | ICD-10-CM

## 2021-06-19 DIAGNOSIS — B009 Herpesviral infection, unspecified: Secondary | ICD-10-CM

## 2021-06-19 DIAGNOSIS — E039 Hypothyroidism, unspecified: Secondary | ICD-10-CM

## 2021-06-19 MED ORDER — VALACYCLOVIR HCL 1 G PO TABS: 1000.0000 mg | ORAL_TABLET | Freq: Three times a day (TID) | ORAL | 3 refills | Status: DC

## 2021-06-19 NOTE — Progress Notes (Signed)
Patient ID: Sarah Hart, female    DOB: 1995-06-04  MRN: [id number]  CC: chronic ds management  Subjective: Sarah Hart is a 26 y.o. female who presents for annual exam Her concerns today include:  Hx of hypothyroid, fhx of breast CA in mother, obesity, anxiety  Hypothyroid: taking Levothyroxine consistently.  Denies any hair loss, fatigue, constipation.  Anxiety/Dep/ADHD: Still seeing Dr. De Nurse.  Doing well on Wellbutrin.  She has not taken Buspar or Inderal in a while.  Feels the stress from grad school was playing a role with the anxiety.  Also learned some coping skills through therapy.    Obesity:  down additional 16 lbs over the last 1 yr.  Eating a lot healthier She was walking on going to the gym but has slacked off over the past month.    She is wanting a prescription for Valtrex to keep on hand for intermittent blistering lesions that she gets on the right upper eyelid.  Patient states it has been occurring intermittently since she was a teenager.  Episodes have become more frequent since 2021.  She thinks the episodes can be triggered by stress.  The last time she had an outbreak, she saw an ophthalmologist who told her it looks like shingles and prescribed Valtrex for her to take 3 times a day for 7 days.  Reports that she also gets fever blisters on the lips.  She has pictures on her cell phone of the right eye during several outbreaks.  She did get vaccine for chickenpox as a child.  Patient Active Problem List   Diagnosis Date Noted   Family history of breast cancer in mother 12/20/2017   Irregular menses 07/23/2017   Class 3 severe obesity without serious comorbidity with body mass index (BMI) of 40.0 to 44.9 in adult [facility]) 07/23/2017   Acquired hypothyroidism 02/08/2017   Allergic rhinitis 02/08/2017     Current Outpatient Medications on File Prior to Visit  Medication Sig Dispense Refill   buPROPion (WELLBUTRIN SR) 200 MG 12 hr tablet Take 1 tablet (200  mg total) by mouth daily. 30 tablet 0   busPIRone (BUSPAR) 5 MG tablet TAKE 1 TABLET(5 MG) BY MOUTH TWICE DAILY 60 tablet 1   fluticasone (FLONASE) 50 MCG/ACT nasal spray Place 1 spray into both nostrils daily. Please schedule PCP appt. 16 g 0   propranolol (INDERAL) 10 MG tablet Take 1 tablet (10 mg total) by mouth as needed. 30 tablet 0   SYNTHROID 50 MCG tablet TAKE 1 TABLET(50 MCG) BY MOUTH DAILY 90 tablet 2   [DISCONTINUED] sertraline (ZOLOFT) 50 MG tablet TAKE 1/2 TABLET BY MOUTH DAILY FOR 2 TO 3 WEEKS THEN TAKE 1 TABLET BY MOUTH DAILY (Patient not taking: Reported on 02/18/2020) 90 tablet 0   No current facility-administered medications on file prior to visit.    No Known Allergies  Social History   Socioeconomic History   Marital status: Single    Spouse name: Not on file   Number of children: Not on file   Years of education: Not on file   Highest education level: Not on file  Occupational History   Not on file  Tobacco Use   Smoking status: Never   Smokeless tobacco: Never  Substance and Sexual Activity   Alcohol use: Yes    Comment: once or twice a month   Drug use: No   Sexual activity: Never  Other Topics Concern   Not on file  Social History Narrative  Not on file   Social Determinants of Health   Financial Resource Strain: Not on file  Food Insecurity: Not on file  Transportation Needs: Not on file  Physical Activity: Not on file  Stress: Not on file  Social Connections: Not on file  Intimate Partner Violence: Not on file    Family History  Problem Relation Age of Onset   Hypertension Mother    Breast cancer Mother        Mother was dx in 2009   Hypertension Father    Multiple myeloma Father 75   Asthma Brother    Pancreatic cancer Maternal Grandmother    Prostate cancer Maternal Grandfather     History reviewed. No pertinent surgical history.  ROS: Review of Systems Negative except as stated above  PHYSICAL EXAM: BP 110/80   Pulse 84    Temp 98.3 F (36.8 C) (Oral)   Resp 16   Ht _0  (1.651 m)   Wt 232 lb 6.4 oz (105.4 kg)   SpO2 97%   BMI 38.67 kg/m   Wt Readings from Last 3 Encounters:  06/19/21 232 lb 6.4 oz (105.4 kg)  06/17/20 248 lb 12.8 oz (112.9 kg)  08/14/19 (!) 257 lb 6.4 oz (116.8 kg)    Physical Exam  General appearance - alert, well appearing, young African-American female and in no distress Mental status - normal mood, behavior, speech, dress, motor activity, and thought processes Neck - supple, no significant adenopathy Chest - clear to auscultation, no wheezes, rales or rhonchi, symmetric air entry Heart - normal rate, regular rhythm, normal S1, S2, no murmurs, rubs, clicks or gallops Extremities - peripheral pulses normal, no pedal edema, no clubbing or cyanosis      Latest Ref Rng & Units 08/21/2018    3:47 PM  CMP  Glucose 65 - 99 mg/dL 91    BUN 6 - 20 mg/dL 9    Creatinine 0.57 - 1.00 mg/dL 0.78    Sodium 134 - 144 mmol/L 141    Potassium 3.5 - 5.2 mmol/L 4.1    Chloride 96 - 106 mmol/L 104    CO2 20 - 29 mmol/L 22    Calcium 8.7 - 10.2 mg/dL 9.4      ASSESSMENT AND PLAN: 1. Acquired hypothyroidism Continue levothyroxine.  Due for thyroid level checked today. - TSH+T4F+T3Free  2. Obesity (BMI 35.0-39.9 without comorbidity) Commended her on weight loss so far.  Encouraged her to continue healthy eating habits. Encouraged to be consistent with exercise activity and make it a priority to get in some form of moderate intensity exercise 3 to 5 days a week.  3. Recurrent HSV (herpes simplex virus) Pics from her cell phone shows small patch of blisters on various location on the right upper eye lid.  It definitely looks like HSV type outbreak.  Advised daily prophylactic therapy given that the outbreaks have become more frequent since 2021.  Advised that if she ever does get shingles on all close to the eye, it can be devastating and an ophthalmologic emergency.  Patient does not want to  do daily therapy.  I have given her prescription for Valtrex to keep on hand for whenever she has an outbreak. - valACYclovir (VALTREX) 1000 MG tablet; Take 1 tablet (1,000 mg total) by mouth 3 (three) times daily.  Dispense: 21 tablet; Refill: 3  4. Performance anxiety Doing well and followed by psychiatry.   Patient was given the opportunity to ask questions.  Patient verbalized understanding  of the plan and was able to repeat key elements of the plan.   This documentation was completed using Radio producer.  Any transcriptional errors are unintentional.  Orders Placed This Encounter  Procedures   TSH+T4F+T3Free     Requested Prescriptions   Signed Prescriptions Disp Refills   valACYclovir (VALTREX) 1000 MG tablet 21 tablet 3    Sig: Take 1 tablet (1,000 mg total) by mouth 3 (three) times daily.    Return in about 1 month (around 07/19/2021) for PAP.  Karle Plumber, MD, FACP

## 2021-06-20 LAB — TSH+T4F+T3FREE
Free T4: 1.25 ng/dL (ref 0.82–1.77)
T3, Free: 3.1 pg/mL (ref 2.0–4.4)
TSH: 2.85 u[IU]/mL (ref 0.450–4.500)

## 2021-06-30 ENCOUNTER — Telehealth (HOSPITAL_COMMUNITY): Payer: Self-pay | Admitting: Psychiatry

## 2021-07-20 ENCOUNTER — Other Ambulatory Visit (HOSPITAL_COMMUNITY): Payer: Self-pay | Admitting: Psychiatry

## 2021-08-01 ENCOUNTER — Ambulatory Visit: Payer: 59 | Admitting: Internal Medicine

## 2021-08-07 ENCOUNTER — Telehealth (INDEPENDENT_AMBULATORY_CARE_PROVIDER_SITE_OTHER): Payer: 59 | Admitting: Psychiatry

## 2021-08-07 ENCOUNTER — Encounter (HOSPITAL_COMMUNITY): Payer: Self-pay | Admitting: Psychiatry

## 2021-08-07 DIAGNOSIS — F4010 Social phobia, unspecified: Secondary | ICD-10-CM | POA: Diagnosis not present

## 2021-08-07 DIAGNOSIS — F341 Dysthymic disorder: Secondary | ICD-10-CM | POA: Diagnosis not present

## 2021-08-07 MED ORDER — PROPRANOLOL HCL 10 MG PO TABS: 10.0000 mg | ORAL_TABLET | ORAL | 0 refills | Status: DC | PRN

## 2021-08-07 NOTE — Progress Notes (Signed)
[place] Follow up visit   Patient Identification: Sarah Hart MRN:  [id number] Date of Evaluation:  08/07/2021 Referral Source: primary care Chief Complaint:   follow up depression, anxiety  Visit Diagnosis:    ICD-10-CM   1. Social anxiety disorder  F40.10     2. Dysthymia  F34.1       Virtual Visit via Video Note  I connected with [name] on 08/07/21 at  4:00 PM EDT by a video enabled telemedicine application and verified that I am speaking with the correct person using two identifiers.  Location: Patient: home Provider: home office   I discussed the limitations of evaluation and management by telemedicine and the availability of in person appointments. The patient expressed understanding and agreed to proceed.      I discussed the assessment and treatment plan with the patient. The patient was provided an opportunity to ask questions and all were answered. The patient agreed with the plan and demonstrated an understanding of the instructions.   The patient was advised to call back or seek an in-person evaluation if the symptoms worsen or if the condition fails to improve as anticipated.  I provided 15 minutes of non-face-to-face time during this encounter.   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, but plan to change job  Lost Uncle this month, family going thru grief and she took some time off Overall managing with meds and increase wellutrin lastvisit  She sees [place] at [facility] for therapy   Denies using drugs or alcohol  Aggravating factor: performance anxiety, Modifying factor:family , friends  Duration since young age  No prior psych admission or suicide attempt Severity : feels if attention needs to be helped with  meds  Past Psychiatric History: anxiety  Previous Psychotropic Medications: Yes    Past Medical History:  Past Medical History:  Diagnosis Date   Hypothyroidism    No past surgical history on file.  Family Psychiatric History: denies  Family History:  Family History  Problem Relation Age of Onset   Hypertension Mother    Breast cancer Mother        Mother was dx in 2009   Hypertension Father    Multiple myeloma Father 52   Asthma Brother    Pancreatic cancer Maternal Grandmother    Prostate cancer Maternal Grandfather     Social History:   Social History   Socioeconomic History   Marital status: Single    Spouse name: Not on file   Number of children: Not on file   Years of education: Not on file   Highest education level: Not on file  Occupational History   Not on file  Tobacco Use   Smoking status: Never   Smokeless tobacco: Never  Substance and Sexual Activity   Alcohol use: Yes    Comment: once or twice a month   Drug use: No   Sexual activity: Never  Other Topics Concern   Not on file  Social History Narrative   Not on file   Social Determinants of Health   Financial Resource Strain: Not on file  Food Insecurity: Not on file  Transportation Needs: Not on file  Physical Activity: Not on file  Stress: Not on file  Social Connections: Not on file      Allergies:  No Known Allergies  Metabolic Disorder Labs: No results found for: "HGBA1C", "MPG" No results found for: "PROLACTIN" No results found for: "CHOL", "TRIG", "HDL", "CHOLHDL", "VLDL", "LDLCALC" Lab Results  Component Value Date   TSH 2.850 06/19/2021    Therapeutic Level Labs: No results found for: "LITHIUM" No results found for: "CBMZ" No results found for: "VALPROATE"  Current Medications: Current Outpatient Medications  Medication Sig Dispense Refill   buPROPion (WELLBUTRIN SR) 200 MG 12 hr tablet TAKE 1 TABLET(200 MG) BY MOUTH DAILY 30 tablet 0   busPIRone (BUSPAR) 5 MG  tablet TAKE 1 TABLET(5 MG) BY MOUTH TWICE DAILY 60 tablet 1   fluticasone (FLONASE) 50 MCG/ACT nasal spray Place 1 spray into both nostrils daily. Please schedule PCP appt. 16 g 0   propranolol (INDERAL) 10 MG tablet Take 1 tablet (10 mg total) by mouth as needed. 30 tablet 0   SYNTHROID 50 MCG tablet TAKE 1 TABLET(50 MCG) BY MOUTH DAILY 90 tablet 2   valACYclovir (VALTREX) 1000 MG tablet Take 1 tablet (1,000 mg total) by mouth 3 (three) times daily. 21 tablet 3   No current facility-administered medications for this visit.      Psychiatric Specialty Exam: Review of Systems  Cardiovascular:  Negative for chest pain.  Skin:  Negative for rash.  Psychiatric/Behavioral:  Negative for agitation and self-injury.     There were no vitals taken for this visit.There is no height or weight on file to calculate BMI.  General Appearance: Casual  Eye Contact:  Fair  Speech:  Normal Rate  Volume:  Normal  Mood: fair  Affect:  Congruent  Thought Process:  Goal Directed  Orientation:  Full (Time, Place, and Person)  Thought Content:  Rumination  Suicidal Thoughts:  No  Homicidal Thoughts:  No  Memory:  Immediate;   Fair Recent;   Fair  Judgement:  Fair  Insight:  Fair  Psychomotor Activity:  Decreased  Concentration:  Concentration: Fair and Attention Span: Fair  Recall:  [employer] of Knowledge:Good  Language: Good  Akathisia:  No  Handed:    AIMS (if indicated):  not done  Assets:  Desire for Improvement Financial Resources/Insurance Housing Social Support  ADL's:  Intact  Cognition: WNL  Sleep:  Fair   Screenings: GAD-7    Personnel officer Visit from 06/17/2020 in [place] from 02/18/2020 in [place] Visit from 08/14/2019 in [place] Office Visit from 08/21/2018 in [place] Visit from 11/08/2017 in [place]  Total GAD-7 Score 1 4 3 2 8       PHQ2-9    [place] Office Visit from 06/19/2021 in [place] Office Visit from 06/17/2020 in [place] Video Visit from 04/18/2020 in [place] from 02/18/2020 in [place] Office Visit from 08/14/2019 in [place]  PHQ-2 Total Score 1 3 1 1 2   PHQ-9 Total Score 3 6 -- -- 4      Flowsheet Row Video Visit from 04/11/2021 in [place] Video Visit from 10/20/2020 in [place] Video Visit from 05/25/2020 in [place] No Risk No Risk No Risk  Assessment and Plan: as follows  Prior documentation reviewed  Social anxiety disorder;fair continue buspar Performance anxiety: takes inderal prn, will send refill ADHD; possible  some better with wellbutrin 252m  Depression; see above, continue wellbutrin, handling it fair  Fu 323m  NaMerian CapronMD 7/24/20234:17 PM

## 2021-08-08 ENCOUNTER — Other Ambulatory Visit (HOSPITAL_COMMUNITY): Payer: Self-pay

## 2021-08-08 MED ORDER — PROPRANOLOL HCL 10 MG PO TABS: 10.0000 mg | ORAL_TABLET | Freq: Every day | ORAL | 0 refills | Status: DC

## 2021-08-24 ENCOUNTER — Encounter: Payer: Self-pay | Admitting: Internal Medicine

## 2021-09-14 ENCOUNTER — Telehealth (HOSPITAL_COMMUNITY): Payer: Self-pay | Admitting: Psychiatry

## 2021-09-14 MED ORDER — BUPROPION HCL ER (SR) 200 MG PO TB12: ORAL_TABLET | ORAL | 1 refills | Status: DC

## 2021-09-14 NOTE — Telephone Encounter (Signed)
Per pt Needs refill on wellbutrin  [facility]  Next Visit - 10/27 Last Visit -7/24

## 2021-09-14 NOTE — Telephone Encounter (Signed)
sent 

## 2021-09-22 ENCOUNTER — Encounter: Payer: Self-pay | Admitting: Internal Medicine

## 2021-09-23 ENCOUNTER — Emergency Department (HOSPITAL_BASED_OUTPATIENT_CLINIC_OR_DEPARTMENT_OTHER): Payer: 59

## 2021-09-23 ENCOUNTER — Other Ambulatory Visit: Payer: Self-pay

## 2021-09-23 ENCOUNTER — Encounter (HOSPITAL_BASED_OUTPATIENT_CLINIC_OR_DEPARTMENT_OTHER): Payer: Self-pay

## 2021-09-23 DIAGNOSIS — R0789 Other chest pain: Secondary | ICD-10-CM | POA: Diagnosis present

## 2021-09-23 DIAGNOSIS — E039 Hypothyroidism, unspecified: Secondary | ICD-10-CM | POA: Insufficient documentation

## 2021-09-23 LAB — CBC
HCT: 42.6 % (ref 36.0–46.0)
Hemoglobin: 13.8 g/dL (ref 12.0–15.0)
MCH: 27.5 pg (ref 26.0–34.0)
MCHC: 32.4 g/dL (ref 30.0–36.0)
MCV: 85 fL (ref 80.0–100.0)
Platelets: 344 10*3/uL (ref 150–400)
RBC: 5.01 MIL/uL (ref 3.87–5.11)
RDW: 13.2 % (ref 11.5–15.5)
WBC: 9.8 10*3/uL (ref 4.0–10.5)
nRBC: 0 % (ref 0.0–0.2)

## 2021-09-23 NOTE — ED Triage Notes (Signed)
Pt states left sided chest pain, sometimes radiating down arm, since Monday. Pt states pain is worse in the evenings.   Denies N/V/D/cough/SHOB.

## 2021-09-24 ENCOUNTER — Emergency Department (HOSPITAL_BASED_OUTPATIENT_CLINIC_OR_DEPARTMENT_OTHER)
Admission: EM | Admit: 2021-09-24 | Discharge: 2021-09-24 | Disposition: A | Discharge status: Home / Self Care | Payer: 59 | Attending: Emergency Medicine | Admitting: Emergency Medicine

## 2021-09-24 DIAGNOSIS — R0789 Other chest pain: Secondary | ICD-10-CM

## 2021-09-24 LAB — BASIC METABOLIC PANEL
Anion gap: 10 (ref 5–15)
BUN: 10 mg/dL (ref 6–20)
CO2: 23 mmol/L (ref 22–32)
Calcium: 10 mg/dL (ref 8.9–10.3)
Chloride: 104 mmol/L (ref 98–111)
Creatinine, Ser: 0.87 mg/dL (ref 0.44–1.00)
GFR, Estimated: >60 mL/min (ref >60)
Glucose, Bld: 119 mg/dL — ABNORMAL HIGH (ref 70–99)
Potassium: 3.3 mmol/L — ABNORMAL LOW (ref 3.5–5.1)
Sodium: 137 mmol/L (ref 135–145)

## 2021-09-24 LAB — TROPONIN I (HIGH SENSITIVITY)
Troponin I (High Sensitivity): <2 ng/L (ref <18)
Troponin I (High Sensitivity): <2 ng/L (ref <18)

## 2021-09-24 LAB — D-DIMER, QUANTITATIVE: D-Dimer, Quant: <0.27 ug/mL-FEU (ref 0.00–0.50)

## 2021-09-24 NOTE — ED Provider Notes (Signed)
[facility] EMERGENCY DEPT  Provider Note  CSN: [id number] Arrival date & time: 09/23/21 1902  History Chief Complaint  Patient presents with   Chest Pain    Sarah Hart is a 26 y.o. female with history of hypothyroidism on synthroid and anxiety reports about a week of intermittent L chest pressure, not described as a pain and not associated with SOB. Sometimes moves into her L arm. Has been worse in the evenings during the week but has been more persistent during the day today. No N/V, fever, cough.    Home Medications Prior to Admission medications   Medication Sig Start Date End Date Taking? Authorizing Provider  buPROPion (WELLBUTRIN SR) 200 MG 12 hr tablet TAKE 1 TABLET(200 MG) BY MOUTH DAILY 09/14/21   Thresa Ross, MD  busPIRone (BUSPAR) 5 MG tablet TAKE 1 TABLET(5 MG) BY MOUTH TWICE DAILY 04/11/21   Thresa Ross, MD  fluticasone (FLONASE) 50 MCG/ACT nasal spray Place 1 spray into both nostrils daily. Please schedule PCP appt. 02/17/19   Marcine Matar, MD  propranolol (INDERAL) 10 MG tablet Take 1 tablet (10 mg total) by mouth daily. 08/08/21   Thresa Ross, MD  SYNTHROID 50 MCG tablet TAKE 1 TABLET(50 MCG) BY MOUTH DAILY 09/15/20   Marcine Matar, MD  valACYclovir (VALTREX) 1000 MG tablet Take 1 tablet (1,000 mg total) by mouth 3 (three) times daily. 06/19/21   Marcine Matar, MD  sertraline (ZOLOFT) 50 MG tablet TAKE 1/2 TABLET BY MOUTH DAILY FOR 2 TO 3 WEEKS THEN TAKE 1 TABLET BY MOUTH DAILY Patient not taking: Reported on 02/18/2020 12/08/19 04/18/20  Marcine Matar, MD     Allergies    Patient has no known allergies.   Review of Systems   Review of Systems Please see HPI for pertinent positives and negatives  Physical Exam BP 130/64   Pulse 83   Temp 99.1 F (37.3 C) (Oral)   Resp 18   Ht 5\' 5"  (1.651 m)   Wt 111.1 kg   LMP 09/02/2021   SpO2 97%   BMI 40.77 kg/m   Physical Exam Vitals and nursing note reviewed.   Constitutional:      Appearance: Normal appearance.  HENT:     Head: Normocephalic and atraumatic.     Nose: Nose normal.     Mouth/Throat:     Mouth: Mucous membranes are moist.  Eyes:     Extraocular Movements: Extraocular movements intact.     Conjunctiva/sclera: Conjunctivae normal.  Cardiovascular:     Rate and Rhythm: Normal rate.  Pulmonary:     Effort: Pulmonary effort is normal.     Breath sounds: Normal breath sounds.  Chest:     Chest wall: No tenderness.  Abdominal:     General: Abdomen is flat.     Palpations: Abdomen is soft.     Tenderness: There is no abdominal tenderness.  Musculoskeletal:        General: No swelling. Normal range of motion.     Cervical back: Neck supple.  Skin:    General: Skin is warm and dry.  Neurological:     General: No focal deficit present.     Mental Status: She is alert.  Psychiatric:        Mood and Affect: Mood normal.     ED Results / Procedures / Treatments   EKG EKG Interpretation  Date/Time:  Saturday September 23 2021 19:08:38 EDT Ventricular Rate:  113 PR Interval:  164 QRS  Duration: 86 QT Interval:  334 QTC Calculation: 458 R Axis:   61 Text Interpretation: Sinus tachycardia Nonspecific T wave abnormality Abnormal ECG No previous ECGs available Confirmed by Susy Frizzle [phone]) on 09/24/2021 2:18:07 AM  Procedures Procedures  Medications Ordered in the ED Medications - No data to display  Initial Impression and Plan  Patient here with chest discomfort. She thought it was anxiety but has been persistent and now more constant this evening prompting her to come to the ED. Initial HR was in the 110s but improved now. Labs done in triage show a normal CBC, BMP and initial Trop. Will add dimer to second trop. Low risk for ACS. Low risk for PE. I personally viewed the images from radiology studies and agree with radiologist interpretation: CXR is clear.    ED Course   Clinical Course as of 09/24/21 0343   Wynelle Link Sep 24, 2021  0316 Repeat trop remains normal.  [CS]  0341 Dimer is neg. No signs of acute life-threatening cause of her symptoms. Vitals are improved. Plan discharged home with PCP follow up.  [CS]    Clinical Course User Index [CS] Pollyann Savoy, MD     MDM Rules/Calculators/A&P Medical Decision Making Problems Addressed: Chest pressure: acute illness or injury  Amount and/or Complexity of Data Reviewed Labs: ordered. Decision-making details documented in ED Course. Radiology: ordered and independent interpretation performed. Decision-making details documented in ED Course. ECG/medicine tests: ordered and independent interpretation performed. Decision-making details documented in ED Course.    Final Clinical Impression(s) / ED Diagnoses Final diagnoses:  Chest pressure    Rx / DC Orders ED Discharge Orders     None        Pollyann Savoy, MD 09/24/21 [phone]

## 2021-09-24 NOTE — ED Notes (Signed)
Patient verbalizes understanding of discharge instructions. Opportunity for questioning and answers were provided. Armband removed by staff, pt discharged from ED. Ambulated out to lobby  

## 2021-09-25 ENCOUNTER — Telehealth: Payer: Self-pay | Admitting: Internal Medicine

## 2021-09-25 ENCOUNTER — Telehealth: Payer: Self-pay | Admitting: Emergency Medicine

## 2021-09-25 ENCOUNTER — Ambulatory Visit: Payer: 59 | Attending: Internal Medicine | Admitting: Internal Medicine

## 2021-09-25 ENCOUNTER — Encounter: Payer: Self-pay | Admitting: Internal Medicine

## 2021-09-25 VITALS — BP 129/87 | HR 95 | Temp 99.1°F | Ht 65.0 in | Wt 245.2 lb

## 2021-09-25 DIAGNOSIS — R0789 Other chest pain: Secondary | ICD-10-CM | POA: Diagnosis not present

## 2021-09-25 DIAGNOSIS — F411 Generalized anxiety disorder: Secondary | ICD-10-CM | POA: Diagnosis not present

## 2021-09-25 DIAGNOSIS — E039 Hypothyroidism, unspecified: Secondary | ICD-10-CM | POA: Diagnosis not present

## 2021-09-25 DIAGNOSIS — R030 Elevated blood-pressure reading, without diagnosis of hypertension: Secondary | ICD-10-CM

## 2021-09-25 DIAGNOSIS — E876 Hypokalemia: Secondary | ICD-10-CM | POA: Diagnosis not present

## 2021-09-25 MED ORDER — POTASSIUM CHLORIDE CRYS ER 10 MEQ PO TBCR: 10.0000 meq | EXTENDED_RELEASE_TABLET | Freq: Every day | ORAL | 0 refills | Status: DC

## 2021-09-25 NOTE — Telephone Encounter (Signed)
Copied from CRM [id number]. Topic: Appointment Scheduling - Scheduling Inquiry for Clinic >> Sep 25, 2021  8:53 AM Lyman Speller wrote: Reason for CRM: Pt stated she was advised to call in and get an urgent care appt with Dr. Laural Benes to have thyroid levels checked / please advise asap

## 2021-09-25 NOTE — Progress Notes (Signed)
Patient ID: Sarah Hart, female    DOB: Mar 31, 1995  MRN: [id number]  CC: f/u ER  Subjective: Sarah Hart is a 26 y.o. female who presents for UC visit and ER f/u Her concerns today include:  Hx of hypothyroid, fhx of breast CA in mother, obesity, anxiety  Patient complains of intermittent pressure/heaviness on the left side of her chest off and on mainly in the evenings when she is at home sitting around.  Does not notice it as much when she is at work always distracted doing all the things.  The pain does not radiate.  No associated numbness or tingling in the arm.  She feels overall tense in the chest and in her upper abdomen.  No associated shortness of breath.  It has been associated with overall increased anxiety, feeling jittery and heart palpitations. -Symptoms started when she drove 3 hours back home over the Labor Day weekend. -She had a relative who died in 08-19-2022 of this year and she felt increased anxiety and worry about her health and her family's health after his death.  Not sleeping well.  She had some hydroxyzine leftover which I had prescribed for her in the past for anxiety.  She took 1 at nights to help calm her down.  She had stopped going to therapy sessions because she was doing good in regards to her anxiety.  However she has called the therapist and made an appointment to try to get back in.  She is supposed to be on BuSpar twice a day but she only takes it once a day.  She has propranolol which she takes to help decrease performance anxiety.  She took 1 this morning but then was afraid to take the BuSpar with it so she has not taken BuSpar today. Seen in the emergency room over the weekend for her symptoms.  She was found to be mildly tachycardic.  Cardiac enzymes negative.  D-dimer was negative.  EKG showed sinus tachycardia with rate of 113.  Potassium level was low at 3.3.  She is on levothyroxine for hypothyroidism. Blood pressure elevated today.  No previous  history of hypertension. Patient Active Problem List   Diagnosis Date Noted   Family history of breast cancer in mother 12/20/2017   Irregular menses 07/23/2017   Class 3 severe obesity without serious comorbidity with body mass index (BMI) of 40.0 to 44.9 in adult [facility]) 07/23/2017   Acquired hypothyroidism 02/08/2017   Allergic rhinitis 02/08/2017     Current Outpatient Medications on File Prior to Visit  Medication Sig Dispense Refill   buPROPion (WELLBUTRIN SR) 200 MG 12 hr tablet TAKE 1 TABLET(200 MG) BY MOUTH DAILY 30 tablet 1   busPIRone (BUSPAR) 5 MG tablet TAKE 1 TABLET(5 MG) BY MOUTH TWICE DAILY 60 tablet 1   fluticasone (FLONASE) 50 MCG/ACT nasal spray Place 1 spray into both nostrils daily. Please schedule PCP appt. 16 g 0   propranolol (INDERAL) 10 MG tablet Take 1 tablet (10 mg total) by mouth daily. 90 tablet 0   SYNTHROID 50 MCG tablet TAKE 1 TABLET(50 MCG) BY MOUTH DAILY 90 tablet 2   valACYclovir (VALTREX) 1000 MG tablet Take 1 tablet (1,000 mg total) by mouth 3 (three) times daily. 21 tablet 3   [DISCONTINUED] sertraline (ZOLOFT) 50 MG tablet TAKE 1/2 TABLET BY MOUTH DAILY FOR 2 TO 3 WEEKS THEN TAKE 1 TABLET BY MOUTH DAILY (Patient not taking: Reported on 02/18/2020) 90 tablet 0   No current facility-administered medications  on file prior to visit.    No Known Allergies  Social History   Socioeconomic History   Marital status: Single    Spouse name: Not on file   Number of children: Not on file   Years of education: Not on file   Highest education level: Not on file  Occupational History   Not on file  Tobacco Use   Smoking status: Never   Smokeless tobacco: Never  Substance and Sexual Activity   Alcohol use: Yes    Comment: once or twice a month   Drug use: No   Sexual activity: Never  Other Topics Concern   Not on file  Social History Narrative   Not on file   Social Determinants of Health   Financial Resource Strain: Not on file  Food Insecurity:  Not on file  Transportation Needs: Not on file  Physical Activity: Not on file  Stress: Not on file  Social Connections: Not on file  Intimate Partner Violence: Not on file    Family History  Problem Relation Age of Onset   Hypertension Mother    Breast cancer Mother        Mother was dx in 2009   Hypertension Father    Multiple myeloma Father 41   Asthma Brother    Pancreatic cancer Maternal Grandmother    Prostate cancer Maternal Grandfather     No past surgical history on file.  ROS: Review of Systems Negative except as stated above  PHYSICAL EXAM: BP 129/87   Pulse 95   Temp 99.1 F (37.3 C) (Oral)   Ht 5' 5"  (1.651 m)   Wt 245 lb 3.2 oz (111.2 kg)   LMP 09/02/2021   SpO2 99%   BMI 40.80 kg/m   Physical Exam  General appearance - alert, well appearing, and in no distress Mental status -patient seems a little anxious. Chest - clear to auscultation, no wheezes, rales or rhonchi, symmetric air entry Heart - normal rate, regular rhythm, normal S1, S2, no murmurs, rubs, clicks or gallops      Latest Ref Rng & Units 09/23/2021   11:34 PM 08/21/2018    3:47 PM  CMP  Glucose 70 - 99 mg/dL 119  91   BUN 6 - 20 mg/dL 10  9   Creatinine 0.44 - 1.00 mg/dL 0.87  0.78   Sodium 135 - 145 mmol/L 137  141   Potassium 3.5 - 5.1 mmol/L 3.3  4.1   Chloride 98 - 111 mmol/L 104  104   CO2 22 - 32 mmol/L 23  22   Calcium 8.9 - 10.3 mg/dL 10.0  9.4     CBC    Component Value Date/Time   WBC 9.8 09/23/2021 2334   RBC 5.01 09/23/2021 2334   HGB 13.8 09/23/2021 2334   HCT 42.6 09/23/2021 2334   PLT 344 09/23/2021 2334   MCV 85.0 09/23/2021 2334   MCH 27.5 09/23/2021 2334   MCHC 32.4 09/23/2021 2334   RDW 13.2 09/23/2021 2334    ASSESSMENT AND PLAN:  1. Atypical chest pain Chest pain is atypical in patient who does not have significant risk factors for heart disease.  I suspect anxiety is playing a role. Recommend that she takes the BuSpar 5 mg twice a day as  prescribed instead of once a day.  Also recommend that she takes propranolol daily for the next several days. -We will check TSH to make sure her levels are still within normal range  on current dose of levothyroxine.  2. GAD (generalized anxiety disorder) See #1 above. She is pursuing try to get in with a therapist sometime this week or next week. Discussed ways to help decrease anxiety including deep breathing exercises and meditation.  3. Hypokalemia We will give potassium supplement to take daily for the next 2 days.  She should return to the lab several days later to have potassium level rechecked. - potassium chloride (KLOR-CON M) 10 MEQ tablet; Take 1 tablet (10 mEq total) by mouth daily.  Dispense: 2 tablet; Refill: 0 - Potassium; Future  4. Acquired hypothyroidism - TSH+T4F+T3Free  5.  Elevated blood pressure without diagnosis of hypertension Repeat blood pressure today is better.  DASH diet discussed and encouraged.  We will plan to recheck blood pressure on next visit.  Patient was given the opportunity to ask questions.  Patient verbalized understanding of the plan and was able to repeat key elements of the plan.   This documentation was completed using Radio producer.  Any transcriptional errors are unintentional.  Orders Placed This Encounter  Procedures   TSH+T4F+T3Free   Potassium     Requested Prescriptions   Signed Prescriptions Disp Refills   potassium chloride (KLOR-CON M) 10 MEQ tablet 2 tablet 0    Sig: Take 1 tablet (10 mEq total) by mouth daily.    No follow-ups on file.  Karle Plumber, MD, FACP

## 2021-09-26 ENCOUNTER — Other Ambulatory Visit: Payer: Self-pay | Admitting: Internal Medicine

## 2021-09-26 DIAGNOSIS — E876 Hypokalemia: Secondary | ICD-10-CM

## 2021-09-26 LAB — TSH+T4F+T3FREE
Free T4: 1.58 ng/dL (ref 0.82–1.77)
T3, Free: 3.1 pg/mL (ref 2.0–4.4)
TSH: 2.36 u[IU]/mL (ref 0.450–4.500)

## 2021-09-27 NOTE — Telephone Encounter (Signed)
1 

## 2021-09-27 NOTE — Telephone Encounter (Signed)
Update/FYI: Pt was seen in office 09/11 w/ pcp in regards to Thyroid levels and lab were collect in office at visit.-----DD,RMA

## 2021-09-27 NOTE — Telephone Encounter (Signed)
Noted. Pt was seen in office 09/11 for appt w/ pcp.----DD,RMA

## 2021-09-29 ENCOUNTER — Ambulatory Visit: Payer: 59 | Attending: Internal Medicine

## 2021-09-29 DIAGNOSIS — E876 Hypokalemia: Secondary | ICD-10-CM

## 2021-09-30 LAB — POTASSIUM: Potassium: 4.4 mmol/L (ref 3.5–5.2)

## 2021-10-26 ENCOUNTER — Encounter (HOSPITAL_COMMUNITY): Payer: Self-pay

## 2021-10-26 ENCOUNTER — Other Ambulatory Visit: Payer: Self-pay

## 2021-10-26 ENCOUNTER — Emergency Department (HOSPITAL_COMMUNITY)
Admission: EM | Admit: 2021-10-26 | Discharge: 2021-10-26 | Disposition: A | Discharge status: Home / Self Care | Payer: 59 | Attending: Emergency Medicine | Admitting: Emergency Medicine

## 2021-10-26 ENCOUNTER — Emergency Department (HOSPITAL_COMMUNITY): Payer: 59

## 2021-10-26 DIAGNOSIS — R0602 Shortness of breath: Secondary | ICD-10-CM | POA: Insufficient documentation

## 2021-10-26 DIAGNOSIS — E039 Hypothyroidism, unspecified: Secondary | ICD-10-CM | POA: Diagnosis not present

## 2021-10-26 DIAGNOSIS — Z79899 Other long term (current) drug therapy: Secondary | ICD-10-CM | POA: Insufficient documentation

## 2021-10-26 DIAGNOSIS — F419 Anxiety disorder, unspecified: Secondary | ICD-10-CM | POA: Diagnosis not present

## 2021-10-26 DIAGNOSIS — N9489 Other specified conditions associated with female genital organs and menstrual cycle: Secondary | ICD-10-CM | POA: Insufficient documentation

## 2021-10-26 DIAGNOSIS — R55 Syncope and collapse: Secondary | ICD-10-CM | POA: Diagnosis not present

## 2021-10-26 LAB — CBC WITH DIFFERENTIAL/PLATELET
Abs Immature Granulocytes: 0.03 10*3/uL (ref 0.00–0.07)
Basophils Absolute: 0 10*3/uL (ref 0.0–0.1)
Basophils Relative: 0 %
Eosinophils Absolute: 0.1 10*3/uL (ref 0.0–0.5)
Eosinophils Relative: 1 %
HCT: 41.7 % (ref 36.0–46.0)
Hemoglobin: 13.5 g/dL (ref 12.0–15.0)
Immature Granulocytes: 0 %
Lymphocytes Relative: 14 %
Lymphs Abs: 1.9 10*3/uL (ref 0.7–4.0)
MCH: 28 pg (ref 26.0–34.0)
MCHC: 32.4 g/dL (ref 30.0–36.0)
MCV: 86.5 fL (ref 80.0–100.0)
Monocytes Absolute: 0.6 10*3/uL (ref 0.1–1.0)
Monocytes Relative: 4 %
Neutro Abs: 10.5 10*3/uL — ABNORMAL HIGH (ref 1.7–7.7)
Neutrophils Relative %: 81 %
Platelets: 475 10*3/uL — ABNORMAL HIGH (ref 150–400)
RBC: 4.82 MIL/uL (ref 3.87–5.11)
RDW: 12.9 % (ref 11.5–15.5)
WBC: 13 10*3/uL — ABNORMAL HIGH (ref 4.0–10.5)
nRBC: 0 % (ref 0.0–0.2)

## 2021-10-26 LAB — HEPATIC FUNCTION PANEL
ALT: 29 U/L (ref 0–44)
AST: 18 U/L (ref 15–41)
Albumin: 4.2 g/dL (ref 3.5–5.0)
Alkaline Phosphatase: 99 U/L (ref 38–126)
Bilirubin, Direct: 0.2 mg/dL (ref 0.0–0.2)
Indirect Bilirubin: 0.7 mg/dL (ref 0.3–0.9)
Total Bilirubin: 0.9 mg/dL (ref 0.3–1.2)
Total Protein: 8.5 g/dL — ABNORMAL HIGH (ref 6.5–8.1)

## 2021-10-26 LAB — I-STAT BETA HCG BLOOD, ED (MC, WL, AP ONLY): I-stat hCG, quantitative: <5 m[IU]/mL (ref <5)

## 2021-10-26 LAB — BASIC METABOLIC PANEL
Anion gap: 7 (ref 5–15)
BUN: 9 mg/dL (ref 6–20)
CO2: 25 mmol/L (ref 22–32)
Calcium: 9.5 mg/dL (ref 8.9–10.3)
Chloride: 107 mmol/L (ref 98–111)
Creatinine, Ser: 0.82 mg/dL (ref 0.44–1.00)
GFR, Estimated: >60 mL/min (ref >60)
Glucose, Bld: 101 mg/dL — ABNORMAL HIGH (ref 70–99)
Potassium: 3.7 mmol/L (ref 3.5–5.1)
Sodium: 139 mmol/L (ref 135–145)

## 2021-10-26 LAB — TSH: TSH: 1.222 u[IU]/mL (ref 0.350–4.500)

## 2021-10-26 LAB — TROPONIN I (HIGH SENSITIVITY): Troponin I (High Sensitivity): <2 ng/L (ref <18)

## 2021-10-26 LAB — D-DIMER, QUANTITATIVE: D-Dimer, Quant: <0.27 ug/mL-FEU (ref 0.00–0.50)

## 2021-10-26 NOTE — ED Triage Notes (Signed)
Patient reports SOB and states she was sitting and became SOB. Patient states when she was walking down the stairs to come to the ED she felt like she  was going to pass out. Patient also c/o "chest pressure" since 1400 today.

## 2021-10-26 NOTE — Discharge Instructions (Signed)
Please follow-up with your PCP. Follow-up with your psychiatrist to discuss changing your anxiety/depression medication. Return to the ED if your symptoms are worsening.

## 2021-10-26 NOTE — ED Provider Triage Note (Signed)
Emergency Medicine Provider Triage Evaluation Note  Sarah Hart , a 26 y.o. female  was evaluated in triage.  Pt complains of SHOB, feeling faint around 2pm today while eating, felt like HR was going too slow. Walking down stairs to apartment to come to the ER felt like breathing shallow, fait, weak. Stll feels like breathing shallow, not as faint. Hx hypothyroid, no recent med changes. Normal PO intake.  Travel to hometown (3 hours away) Mild URI symptoms a few weeks ago with fever. Non smoker, no OCP, no cancer history. No personal or family hx of PE/DVT Review of Systems  Positive: As above Negative: As above  Physical Exam  BP 137/87 (BP Location: Left Arm)   Pulse 97   Temp 99.1 F (37.3 C) (Oral)   Resp 18   Ht 5\' 5"  (1.651 m)   Wt 109.8 kg   LMP 09/28/2021   SpO2 98%   BMI 40.27 kg/m  Gen:   Awake, no distress   Resp:  Normal effort  MSK:   Moves extremities without difficulty  Other:    Medical Decision Making  Medically screening exam initiated at 6:40 PM.  Appropriate orders placed.  Lupie Sawa was informed that the remainder of the evaluation will be completed by another provider, this initial triage assessment does not replace that evaluation, and the importance of remaining in the ED until their evaluation is complete.     Tacy Learn, PA-C 10/26/21 1842

## 2021-10-26 NOTE — ED Notes (Signed)
Pt ambulated from ed with steady gait. Pt dressed for discharge. Pt has access to home. Pt verbalized understanding of discharge instructions.

## 2021-10-26 NOTE — ED Provider Notes (Signed)
[place] DEPT Provider Note   CSN: [id number] Arrival date & time: 10/26/21  1750     History  Chief Complaint  Patient presents with   Shortness of Breath   Near Syncope    Roselin Wiemann is a 26 y.o. female, history of hypothyroidism, who presents to the ED secondary to shortness of breath today started around 2 PM.  She states that she feels like she was very short of breath, and it was about to pass out, breathing got worse when she started walking down the stairs of her apartment, so she came to the ER.  She reports that she has not had any nausea, vomiting, chest discomfort, urinary symptoms.  Does state that sometimes it feels like she has a very fast heartbeat.  Was recently in the ER for similar symptoms, but this included chest pain at that time.  Denies any abdominal pain with this, or changes in thyroid medication   Shortness of Breath Associated symptoms: no abdominal pain, no chest pain and no vomiting   Near Syncope Associated symptoms include shortness of breath. Pertinent negatives include no chest pain and no abdominal pain.       Home Medications Prior to Admission medications   Medication Sig Start Date End Date Taking? Authorizing Provider  buPROPion (WELLBUTRIN SR) 200 MG 12 hr tablet TAKE 1 TABLET(200 MG) BY MOUTH DAILY 09/14/21   Merian Capron, MD  busPIRone (BUSPAR) 5 MG tablet TAKE 1 TABLET(5 MG) BY MOUTH TWICE DAILY 04/11/21   Merian Capron, MD  fluticasone (FLONASE) 50 MCG/ACT nasal spray Place 1 spray into both nostrils daily. Please schedule PCP appt. 02/17/19   Ladell Pier, MD  potassium chloride (KLOR-CON M) 10 MEQ tablet Take 1 tablet (10 mEq total) by mouth daily. 09/25/21   Ladell Pier, MD  propranolol (INDERAL) 10 MG tablet Take 1 tablet (10 mg total) by mouth daily. 08/08/21   Merian Capron, MD  SYNTHROID 50 MCG tablet TAKE 1 TABLET(50 MCG) BY MOUTH DAILY 09/15/20   Ladell Pier, MD   valACYclovir (VALTREX) 1000 MG tablet Take 1 tablet (1,000 mg total) by mouth 3 (three) times daily. 06/19/21   Ladell Pier, MD  sertraline (ZOLOFT) 50 MG tablet TAKE 1/2 TABLET BY MOUTH DAILY FOR 2 TO 3 WEEKS THEN TAKE 1 TABLET BY MOUTH DAILY Patient not taking: Reported on 02/18/2020 12/08/19 04/18/20  Ladell Pier, MD      Allergies    Patient has no known allergies.    Review of Systems   Review of Systems  Respiratory:  Positive for shortness of breath.   Cardiovascular:  Positive for near-syncope. Negative for chest pain.  Gastrointestinal:  Negative for abdominal pain, nausea and vomiting.    Physical Exam Updated Vital Signs BP (!) 138/90   Pulse 95   Temp 99.1 F (37.3 C) (Oral)   Resp (!) 21   Ht 5\' 5"  (1.651 m)   Wt 109.8 kg   LMP 09/28/2021   SpO2 100%   BMI 40.27 kg/m  Physical Exam Vitals and nursing note reviewed.  Constitutional:      General: She is not in acute distress.    Appearance: She is well-developed. She is obese.  HENT:     Head: Normocephalic and atraumatic.  Eyes:     Conjunctiva/sclera: Conjunctivae normal.  Cardiovascular:     Rate and Rhythm: Normal rate and regular rhythm.     Heart sounds: No murmur heard. Pulmonary:  Effort: Pulmonary effort is normal. No respiratory distress.     Breath sounds: Normal breath sounds.  Abdominal:     Palpations: Abdomen is soft.     Tenderness: There is no abdominal tenderness.  Musculoskeletal:        General: No swelling.     Cervical back: Neck supple.  Skin:    General: Skin is warm and dry.     Capillary Refill: Capillary refill takes less than 2 seconds.  Neurological:     Mental Status: She is alert.  Psychiatric:        Mood and Affect: Mood normal.     ED Results / Procedures / Treatments   Labs (all labs ordered are listed, but only abnormal results are displayed) Labs Reviewed  BASIC METABOLIC PANEL - Abnormal; Notable for the following components:      Result  Value   Glucose, Bld 101 (*)    All other components within normal limits  CBC WITH DIFFERENTIAL/PLATELET - Abnormal; Notable for the following components:   WBC 13.0 (*)    Platelets 475 (*)    Neutro Abs 10.5 (*)    All other components within normal limits  HEPATIC FUNCTION PANEL - Abnormal; Notable for the following components:   Total Protein 8.5 (*)    All other components within normal limits  D-DIMER, QUANTITATIVE  TSH  I-STAT BETA HCG BLOOD, ED ([facility], [facility], [facility] ONLY)  TROPONIN I (HIGH SENSITIVITY)    EKG None  Radiology DG Chest 2 View  Result Date: 10/26/2021 CLINICAL DATA:  Shortness of breath EXAM: CHEST - 2 VIEW COMPARISON:  09/23/2021 FINDINGS: Cardiac and mediastinal contours are within normal limits. No focal pulmonary opacity. No pleural effusion or pneumothorax. No acute osseous abnormality. IMPRESSION: No acute cardiopulmonary process. Electronically Signed   By: Wiliam Ke M.D.   On: 10/26/2021 19:21    Procedures Procedures    Medications Ordered in ED Medications - No data to display  ED Course/ Medical Decision Making/ A&P                           Medical Decision Making Amount and/or Complexity of Data Reviewed Labs: ordered.   Patient is a 26 year old female, here for shortness of breath, that has now resolved, while in the ED her oxygen saturations were above 95%, and she was nontachycardic.  D-dimer was negative, troponin negative.  EKG fairly unremarkable.  No ST elevation or depression.  Chest x-ray was unremarkable as well.  TSH within normal limits.  Discussed with patient, this may be secondary to anxiety, she does not have any abdominal pain, thus unlikely to be secondary to gallbladder issues.  Discussed return precautions, need for follow-up with PCP.  She voiced understanding.  Open Final Clinical Impression(s) / ED Diagnoses Final diagnoses:  Shortness of breath  Anxiety    Rx / DC Orders ED Discharge Orders     None          Mekiah Wahler, [place]ley [place], PA 10/26/21 2106    Loetta Rough, MD 10/26/21 2333

## 2021-10-27 ENCOUNTER — Ambulatory Visit: Payer: 59 | Attending: Internal Medicine | Admitting: Internal Medicine

## 2021-10-27 ENCOUNTER — Emergency Department (HOSPITAL_COMMUNITY)
Admission: EM | Admit: 2021-10-27 | Discharge: 2021-10-28 | Disposition: A | Discharge status: Home / Self Care | Payer: 59 | Attending: Emergency Medicine | Admitting: Emergency Medicine

## 2021-10-27 ENCOUNTER — Other Ambulatory Visit (HOSPITAL_COMMUNITY)
Admission: RE | Admit: 2021-10-27 | Discharge: 2021-10-27 | Disposition: A | Discharge status: Home / Self Care | Payer: 59 | Source: Ambulatory Visit | Attending: Internal Medicine | Admitting: Internal Medicine

## 2021-10-27 VITALS — BP 127/85 | HR 99 | Temp 98.0°F | Ht 65.0 in | Wt 234.0 lb

## 2021-10-27 DIAGNOSIS — Z79899 Other long term (current) drug therapy: Secondary | ICD-10-CM | POA: Diagnosis not present

## 2021-10-27 DIAGNOSIS — F411 Generalized anxiety disorder: Secondary | ICD-10-CM

## 2021-10-27 DIAGNOSIS — D72829 Elevated white blood cell count, unspecified: Secondary | ICD-10-CM | POA: Diagnosis not present

## 2021-10-27 DIAGNOSIS — R350 Frequency of micturition: Secondary | ICD-10-CM | POA: Insufficient documentation

## 2021-10-27 DIAGNOSIS — Z124 Encounter for screening for malignant neoplasm of cervix: Secondary | ICD-10-CM

## 2021-10-27 DIAGNOSIS — E039 Hypothyroidism, unspecified: Secondary | ICD-10-CM | POA: Insufficient documentation

## 2021-10-27 DIAGNOSIS — R030 Elevated blood-pressure reading, without diagnosis of hypertension: Secondary | ICD-10-CM

## 2021-10-27 DIAGNOSIS — D75839 Thrombocytosis, unspecified: Secondary | ICD-10-CM | POA: Insufficient documentation

## 2021-10-27 DIAGNOSIS — E876 Hypokalemia: Secondary | ICD-10-CM | POA: Insufficient documentation

## 2021-10-27 DIAGNOSIS — M545 Low back pain, unspecified: Secondary | ICD-10-CM

## 2021-10-27 NOTE — Patient Instructions (Addendum)
Try to obtain a blood pressure device and check your blood pressure at least about once a week.  We will have you follow-up with our clinical pharmacist in 3 weeks for repeat blood pressure check.  Normal blood pressure is 120/80 or lower. Please return to the lab in about 3 weeks for repeat blood test to check your blood cell count. [facility] [address], [place], [place] [address] [phone] or [phone] Walk-in urgent care 24/7 for anyone  For [facility] ONLY New patient assessments and therapy walk-ins: Monday and Wednesday 8am-11am First and second Friday 1pm-5pm New patient psychiatry and medication management walk-ins: Mondays, Wednesdays, Thursdays, Fridays 8am-11am No psychiatry walk-ins on Tuesday   *Accepts all insurance and uninsured for Urgent Care needs *Accepts Medicaid and uninsured for outpatient treatment   [facility] (Therapy and psychiatry) [facility] (near [employer]) [address] [place], [place] [address] [phone] Fax: [phone] (INSURANCE REQUIRED-MEDICAID ACCEPTED)   [facility] Outpatient Behavioral Health at [facility] [address] [place] [address] [place],  [place]  [address] [phone] Call for appointment  [facility] (Therapy only)  [facility] [address], [place], [place] [address] Monday - Friday: 8:30 a.m.-12 p.m. / 1 p.m.-2:30 p.m.  [facility] [address], [place], [place] [address] Monday-Friday: 8:30 a.m.-12 p.m. / 2-3:30 p.m. (INSURANCE REQUIRED -MEDICAID ACCEPTED) They do offer a sliding fee scale $20-$30/session   [facility] [address] [place], [place] [address] Phone: [phone]  [facility] [address] [place] [address]  Phone: [phone] (Does not accept Medicaid) (only one provider accepts Medicare) [facility] [address] (at [employer]) [address] [place], [place] [address] (Accepts Medicaid and Medicare)  [facility]  [address] [place] [address]  [place], [place] [address]  Phone: [phone]  [address] [place], [place] [address] Phone: [phone] [facility] Medicaid) [facility] (Therapy only)  [address] [place], [place] [address] Phone: [phone]   [facility] (Therapy only)  [address] [place], [place] [address] Phone: [phone] [facility] & Medicare)   [facility] [address], Suite [place], [place] [address] Phone: [phone] (Accepts [place] Health Choice) [facility] [phone] [address] [place], [place] [address] Phone: [phone] [facility]) [facility] (Psychiatry only)  [phone] [address], [place], [place] [address] (Accepts Medicaid and Medicare) [facility] (Psychiatry and therapy)  [address] Lonell Grandchild [place], [place] [address] [phone] [facility] Medicare) [facility] (psychiatry and therapy) [address], [place], [place] [address] [phone]  [facility] at [address], [place], [place] [address] [phone] Accept [address], [place], [place], [place], [place],  and the following types of Medicaid; Alliance, [place], Partners, [place], [place] Health Choice, [employer], Healthy [place], [place], and [place], as well as offering a Careers adviser and private payment options. Provides In-Office Appointments, Virtual Appointments, and Phone Consultations Offers medication management for ages 50 years old and up, including,  Medication Management for Suboxone and [facility] [phone] [address], [place], [place] [address] (Accepts Medicaid and Medicare)         19.  [facility] (therapy only)  [address] [place],  [place] [address]            [phone] (Accepts medicare) 20. [facility]  (Suboxone and methodone) [phone] [address], [place], [place] [address] To Be Eligible for Opioid Treatment at [facility] you must be at least 26 years of age you have already tried other interventions that were not successful such as opioid detox, inpatient rehab for opioids, or outpatient counseling specifically for opioid dependency your [facility] drug test must be completely free of benzodiazepines (klonopin, xanax, valium, ativan, or other benz) you have reliable transportation to the [facility] clinic in [place] you recognize that counseling is a critical component of [facility]' Opioid Program and you agree to attend all required counseling sessions you are committed to total drug abstinence and will conscientiously strive to remain free of alcohol, marijuana, and other illicit substances while in treatment you desire a peaceful treatment atmosphere in which personal responsibility and respect toward staff and clients is the norm   21. [facility] [place], [place], [place] [address] Offers SAIOP (Substance Abuse Intensive Outpatient Program) [phone] 22. [facility] [address] [place], [place] [address] [phone] (Accepts medicare)  For those who are tech savvy, go on psychology today, type in your local city (i.e. [place]. ) and specify your insurance at the top of the screen after you search. (Medicaid if needed). You can also specify whether you are interested in therapy and psychiatry.  [email or web address]/us

## 2021-10-27 NOTE — Progress Notes (Signed)
Patient ID: Sarah Hart, female    DOB: 1995/11/17  MRN: [id number]  CC: Gynecologic Exam (Pap. ER visit yesterday due to shortness of breath/No to flu vax.)   Subjective: Sarah Hart is a 26 y.o. female who presents for PAP and BP recheck Her concerns today include:  Hx of hypothyroid, fhx of breast CA in mother, obesity, anxiety  Anx: Seen in the emergency room yesterday with shortness of breath.  Work-up was negative.  CBC showed mild elevation in WBC and platelet count. Sees counselor Q2-3 wks.  Helpful but may be more beneficial if she could see the counselor once a week.  She inquired about it but counselor schedule does not allow for it at this time. Will meet with her psychiatrist the end of this mth.  She is thinking about having him discontinue Wellbutrin as it may be contributing to some of her symptoms. -started taking Propranolol and Buspar daily and this has been helpful.  Changed to taking Wellbutrin QOD.  Feels it suppresses appetite  GYN History:  Pt is G0P0 Any hx of abn paps?: no Menses regular or irregular?: regular for past 3-4 mths How long does menses last?  5 days Menstrual flow light or heavy?:  moderate Method of birth control?:  no Any vaginal dischg at this time?:  no Dysuria?:  no Any hx of STI?: no Sexually active with how many partners: no Desires STI screen: no Last MMG: NA Family hx of uterine, cervical or breast cancer?:  mom has breast cancer.  Dx at age 53-44.  Not sure if mom had BRCC gene screening  Elev BP:  trying to limit salt BP elev on last visit and in ER yesterday. Patient Active Problem List   Diagnosis Date Noted   Family history of breast cancer in mother 12/20/2017   Irregular menses 07/23/2017   Class 3 severe obesity without serious comorbidity with body mass index (BMI) of 40.0 to 44.9 in adult [facility]) 07/23/2017   Acquired hypothyroidism 02/08/2017   Allergic rhinitis 02/08/2017     Current Outpatient  Medications on File Prior to Visit  Medication Sig Dispense Refill   buPROPion (WELLBUTRIN SR) 200 MG 12 hr tablet TAKE 1 TABLET(200 MG) BY MOUTH DAILY 30 tablet 1   busPIRone (BUSPAR) 5 MG tablet TAKE 1 TABLET(5 MG) BY MOUTH TWICE DAILY 60 tablet 1   fluticasone (FLONASE) 50 MCG/ACT nasal spray Place 1 spray into both nostrils daily. Please schedule PCP appt. 16 g 0   potassium chloride (KLOR-CON M) 10 MEQ tablet Take 1 tablet (10 mEq total) by mouth daily. (Patient not taking: Reported on 10/27/2021) 2 tablet 0   propranolol (INDERAL) 10 MG tablet Take 1 tablet (10 mg total) by mouth daily. 90 tablet 0   SYNTHROID 50 MCG tablet TAKE 1 TABLET(50 MCG) BY MOUTH DAILY 90 tablet 2   valACYclovir (VALTREX) 1000 MG tablet Take 1 tablet (1,000 mg total) by mouth 3 (three) times daily. 21 tablet 3   [DISCONTINUED] sertraline (ZOLOFT) 50 MG tablet TAKE 1/2 TABLET BY MOUTH DAILY FOR 2 TO 3 WEEKS THEN TAKE 1 TABLET BY MOUTH DAILY (Patient not taking: Reported on 02/18/2020) 90 tablet 0   No current facility-administered medications on file prior to visit.    No Known Allergies  Social History   Socioeconomic History   Marital status: Single    Spouse name: Not on file   Number of children: Not on file   Years of education: Not on file  Highest education level: Not on file  Occupational History   Not on file  Tobacco Use   Smoking status: Never   Smokeless tobacco: Never  Vaping Use   Vaping Use: Never used  Substance and Sexual Activity   Alcohol use: Not Currently    Comment: once or twice a month   Drug use: No   Sexual activity: Never  Other Topics Concern   Not on file  Social History Narrative   Not on file   Social Determinants of Health   Financial Resource Strain: Not on file  Food Insecurity: Not on file  Transportation Needs: Not on file  Physical Activity: Not on file  Stress: Not on file  Social Connections: Not on file  Intimate Partner Violence: Not on file     Family History  Problem Relation Age of Onset   Hypertension Mother    Breast cancer Mother        Mother was dx in 2009   Hypertension Father    Multiple myeloma Father 5   Asthma Brother    Pancreatic cancer Maternal Grandmother    Prostate cancer Maternal Grandfather     No past surgical history on file.  ROS: Review of Systems Negative except as stated above  PHYSICAL EXAM: BP 127/85 (BP Location: Left Arm, Patient Position: Sitting, Cuff Size: Large)   Pulse 99   Temp 98 F (36.7 C) (Oral)   Ht _0  (1.651 m)   Wt 234 lb (106.1 kg)   LMP 09/28/2021   SpO2 (!) 9%   BMI 38.94 kg/m   Physical Exam BP 130/88 General appearance - alert, well appearing, young African-American female and in no distress Mental status - normal mood, behavior, speech, dress, motor activity, and thought processes Pelvic -CMA Oretha Milch present: Normal external genitalia, vulva, vagina, cervix, uterus and adnexa      Latest Ref Rng & Units 10/26/2021    7:06 PM 09/29/2021   11:57 AM 09/23/2021   11:34 PM  CMP  Glucose 70 - 99 mg/dL 101   119   BUN 6 - 20 mg/dL 9   10   Creatinine 0.44 - 1.00 mg/dL 0.82   0.87   Sodium 135 - 145 mmol/L 139   137   Potassium 3.5 - 5.1 mmol/L 3.7  4.4  3.3   Chloride 98 - 111 mmol/L 107   104   CO2 22 - 32 mmol/L 25   23   Calcium 8.9 - 10.3 mg/dL 9.5   10.0   Total Protein 6.5 - 8.1 g/dL 8.5     Total Bilirubin 0.3 - 1.2 mg/dL 0.9     Alkaline Phos 38 - 126 U/L 99     AST 15 - 41 U/L 18     ALT 0 - 44 U/L 29      CBC    Component Value Date/Time   WBC 13.0 (H) 10/26/2021 1906   RBC 4.82 10/26/2021 1906   HGB 13.5 10/26/2021 1906   HCT 41.7 10/26/2021 1906   PLT 475 (H) 10/26/2021 1906   MCV 86.5 10/26/2021 1906   MCH 28.0 10/26/2021 1906   MCHC 32.4 10/26/2021 1906   RDW 12.9 10/26/2021 1906   LYMPHSABS 1.9 10/26/2021 1906   MONOABS 0.6 10/26/2021 1906   EOSABS 0.1 10/26/2021 1906   BASOSABS 0.0 10/26/2021 1906     ASSESSMENT AND PLAN: 1. Pap smear for cervical cancer screening - Cytology - PAP  2. GAD (generalized anxiety  disorder) Patient will keep upcoming appointment with psychiatrist.  Advised that she touch base with him prior to that via MyChart to let him know some of the symptoms that she is having that may be related to Wellbutrin.  3. Elevated blood pressure reading without diagnosis of hypertension Continue DASH diet.  Recommend that she try to get a home blood pressure monitoring device and check blood pressure once to twice a week.  Follow-up with clinical pharmacist in several weeks for recheck.  4. Leukocytosis, unspecified type Recommend that she returns to the lab in about 3 weeks to have blood count rechecked. - CBC With Differential   Patient was given the opportunity to ask questions.  Patient verbalized understanding of the plan and was able to repeat key elements of the plan.   This documentation was completed using Radio producer.  Any transcriptional errors are unintentional.  Orders Placed This Encounter  Procedures   CBC With Differential     Requested Prescriptions    No prescriptions requested or ordered in this encounter    Return in about 4 months (around 02/27/2022) for Appt with [facility] in 3 wks for BP check.  Karle Plumber, MD, FACP

## 2021-10-28 ENCOUNTER — Other Ambulatory Visit: Payer: Self-pay

## 2021-10-28 ENCOUNTER — Encounter (HOSPITAL_COMMUNITY): Payer: Self-pay

## 2021-10-28 LAB — URINALYSIS, ROUTINE W REFLEX MICROSCOPIC
Bilirubin Urine: NEGATIVE
Glucose, UA: NEGATIVE mg/dL
Ketones, ur: 20 mg/dL — AB
Nitrite: NEGATIVE
Protein, ur: NEGATIVE mg/dL
Specific Gravity, Urine: 1.019 (ref 1.005–1.030)
WBC, UA: >50 WBC/hpf — ABNORMAL HIGH (ref 0–5)
pH: 5 (ref 5.0–8.0)

## 2021-10-28 LAB — COMPREHENSIVE METABOLIC PANEL
ALT: 22 U/L (ref 0–44)
AST: 15 U/L (ref 15–41)
Albumin: 3.9 g/dL (ref 3.5–5.0)
Alkaline Phosphatase: 91 U/L (ref 38–126)
Anion gap: 5 (ref 5–15)
BUN: 10 mg/dL (ref 6–20)
CO2: 25 mmol/L (ref 22–32)
Calcium: 9 mg/dL (ref 8.9–10.3)
Chloride: 106 mmol/L (ref 98–111)
Creatinine, Ser: 0.88 mg/dL (ref 0.44–1.00)
GFR, Estimated: >60 mL/min (ref >60)
Glucose, Bld: 137 mg/dL — ABNORMAL HIGH (ref 70–99)
Potassium: 3.1 mmol/L — ABNORMAL LOW (ref 3.5–5.1)
Sodium: 136 mmol/L (ref 135–145)
Total Bilirubin: 0.8 mg/dL (ref 0.3–1.2)
Total Protein: 7.9 g/dL (ref 6.5–8.1)

## 2021-10-28 LAB — CBC WITH DIFFERENTIAL/PLATELET
Abs Immature Granulocytes: 0.03 10*3/uL (ref 0.00–0.07)
Basophils Absolute: 0 10*3/uL (ref 0.0–0.1)
Basophils Relative: 0 %
Eosinophils Absolute: 0.1 10*3/uL (ref 0.0–0.5)
Eosinophils Relative: 1 %
HCT: 39.4 % (ref 36.0–46.0)
Hemoglobin: 12.6 g/dL (ref 12.0–15.0)
Immature Granulocytes: 0 %
Lymphocytes Relative: 24 %
Lymphs Abs: 2.4 10*3/uL (ref 0.7–4.0)
MCH: 27.3 pg (ref 26.0–34.0)
MCHC: 32 g/dL (ref 30.0–36.0)
MCV: 85.5 fL (ref 80.0–100.0)
Monocytes Absolute: 0.7 10*3/uL (ref 0.1–1.0)
Monocytes Relative: 7 %
Neutro Abs: 6.8 10*3/uL (ref 1.7–7.7)
Neutrophils Relative %: 68 %
Platelets: 459 10*3/uL — ABNORMAL HIGH (ref 150–400)
RBC: 4.61 MIL/uL (ref 3.87–5.11)
RDW: 13 % (ref 11.5–15.5)
WBC: 10.1 10*3/uL (ref 4.0–10.5)
nRBC: 0 % (ref 0.0–0.2)

## 2021-10-28 LAB — PREGNANCY, URINE: Preg Test, Ur: NEGATIVE

## 2021-10-28 LAB — CBC WITH DIFFERENTIAL
Basophils Absolute: 0 10*3/uL (ref 0.0–0.2)
Basos: 0 %
EOS (ABSOLUTE): 0 10*3/uL (ref 0.0–0.4)
Eos: 0 %
Hematocrit: 39.2 % (ref 34.0–46.6)
Hemoglobin: 12.7 g/dL (ref 11.1–15.9)
Immature Grans (Abs): 0 10*3/uL (ref 0.0–0.1)
Immature Granulocytes: 0 %
Lymphocytes Absolute: 1.8 10*3/uL (ref 0.7–3.1)
Lymphs: 14 %
MCH: 27.2 pg (ref 26.6–33.0)
MCHC: 32.4 g/dL (ref 31.5–35.7)
MCV: 84 fL (ref 79–97)
Monocytes Absolute: 0.8 10*3/uL (ref 0.1–0.9)
Monocytes: 6 %
Neutrophils Absolute: 9.8 10*3/uL — ABNORMAL HIGH (ref 1.4–7.0)
Neutrophils: 80 %
RBC: 4.67 x10E6/uL (ref 3.77–5.28)
RDW: 13.2 % (ref 11.7–15.4)
WBC: 12.4 10*3/uL — ABNORMAL HIGH (ref 3.4–10.8)

## 2021-10-28 MED ORDER — NITROFURANTOIN MONOHYD MACRO 100 MG PO CAPS: 100.0000 mg | ORAL_CAPSULE | Freq: Two times a day (BID) | ORAL | 0 refills | Status: DC

## 2021-10-28 MED ORDER — ACETAMINOPHEN 325 MG PO TABS
650.0000 mg | ORAL_TABLET | Freq: Once | ORAL | Status: AC
  Administered 2021-10-28: 650 mg via ORAL
  Filled 2021-10-28: qty 2

## 2021-10-28 MED ORDER — POTASSIUM CHLORIDE CRYS ER 20 MEQ PO TBCR: 20.0000 meq | EXTENDED_RELEASE_TABLET | Freq: Every day | ORAL | 0 refills | Status: DC

## 2021-10-28 MED ORDER — POTASSIUM CHLORIDE CRYS ER 20 MEQ PO TBCR
40.0000 meq | EXTENDED_RELEASE_TABLET | Freq: Once | ORAL | Status: AC
  Administered 2021-10-28: 40 meq via ORAL
  Filled 2021-10-28: qty 2

## 2021-10-28 MED ORDER — NAPROXEN 500 MG PO TABS: 500.0000 mg | ORAL_TABLET | Freq: Two times a day (BID) | ORAL | 0 refills | Status: DC | PRN

## 2021-10-28 NOTE — ED Provider Notes (Signed)
[facility]-EMERGENCY DEPT Provider Note   CSN: [id number] Arrival date & time: 10/27/21  2355     History  Chief Complaint  Patient presents with   Back Pain   Pelvic Pain    Sarah Hart is a 26 y.o. female with a history of hypothyroidism who presents to the emergency department with complaints of back/pelvic pain that began around 1930.  She states the pain is to diffuse back and pelvis, crampy/achy in nature, no specific alleviating or aggravating factors, however it has seemed to ease off to a 2 out of 10 in severity currently.  She has had some urinary frequency over the past 24 hours and did have some chills.  She denies fever, vomiting, dysuria, or vaginal discharge.  She does feel like she is starting her period, her LMP was 09/27/2021, she started to have some spotting.  She had a Pap smear and full pelvic exam yesterday.  She is not sexually active and has no concern for STDs.  HPI     Home Medications Prior to Admission medications   Medication Sig Start Date End Date Taking? Authorizing Provider  buPROPion (WELLBUTRIN SR) 200 MG 12 hr tablet TAKE 1 TABLET(200 MG) BY MOUTH DAILY 09/14/21  Yes Thresa Ross, MD  busPIRone (BUSPAR) 5 MG tablet TAKE 1 TABLET(5 MG) BY MOUTH TWICE DAILY 04/11/21  Yes Thresa Ross, MD  propranolol (INDERAL) 10 MG tablet Take 1 tablet (10 mg total) by mouth daily. 08/08/21  Yes Thresa Ross, MD  SYNTHROID 50 MCG tablet TAKE 1 TABLET(50 MCG) BY MOUTH DAILY 09/15/20  Yes Marcine Matar, MD  fluticasone [facility]) 50 MCG/ACT nasal spray Place 1 spray into both nostrils daily. Please schedule PCP appt. Patient not taking: Reported on 10/28/2021 02/17/19   Marcine Matar, MD  potassium chloride (KLOR-CON M) 10 MEQ tablet Take 1 tablet (10 mEq total) by mouth daily. Patient not taking: Reported on 10/27/2021 09/25/21   Marcine Matar, MD  valACYclovir (VALTREX) 1000 MG tablet Take 1 tablet (1,000 mg total) by mouth 3  (three) times daily. Patient not taking: Reported on 10/28/2021 06/19/21   Marcine Matar, MD  sertraline (ZOLOFT) 50 MG tablet TAKE 1/2 TABLET BY MOUTH DAILY FOR 2 TO 3 WEEKS THEN TAKE 1 TABLET BY MOUTH DAILY Patient not taking: Reported on 02/18/2020 12/08/19 04/18/20  Marcine Matar, MD      Allergies    Patient has no known allergies.    Review of Systems   Review of Systems  Constitutional:  Positive for chills. Negative for fever.  Respiratory:  Negative for shortness of breath.   Cardiovascular:  Negative for chest pain.  Gastrointestinal:  Negative for diarrhea and vomiting.  Genitourinary:  Positive for frequency and vaginal bleeding. Negative for dysuria and vaginal discharge.  Musculoskeletal:  Positive for back pain.  Neurological:  Negative for syncope, weakness and numbness.  All other systems reviewed and are negative.   Physical Exam Updated Vital Signs BP (!) 144/84   Pulse 94   Temp 98 F (36.7 C) (Oral)   Resp 16   LMP 09/28/2021   SpO2 100%  Physical Exam Vitals and nursing note reviewed.  Constitutional:      General: She is not in acute distress.    Appearance: She is well-developed. She is not toxic-appearing.  HENT:     Head: Normocephalic and atraumatic.  Eyes:     General:        Right eye: No discharge.  Left eye: No discharge.     Conjunctiva/sclera: Conjunctivae normal.  Cardiovascular:     Rate and Rhythm: Normal rate and regular rhythm.  Pulmonary:     Effort: No respiratory distress.     Breath sounds: Normal breath sounds. No wheezing or rales.  Abdominal:     General: There is no distension.     Palpations: Abdomen is soft.     Tenderness: There is no abdominal tenderness. There is no right CVA tenderness, left CVA tenderness, guarding or rebound.  Genitourinary:    Comments: Patient declined Musculoskeletal:     Cervical back: Neck supple.  Skin:    General: Skin is warm and dry.  Neurological:     Mental Status: She  is alert.     Comments: Clear speech.  Strength and sensation grossly intact bilateral lower extremities.  Psychiatric:        Behavior: Behavior normal.     ED Results / Procedures / Treatments   Labs (all labs ordered are listed, but only abnormal results are displayed) Labs Reviewed  URINALYSIS, ROUTINE W REFLEX MICROSCOPIC - Abnormal; Notable for the following components:      Result Value   APPearance HAZY (*)    Hgb urine dipstick LARGE (*)    Ketones, ur 20 (*)    Leukocytes,Ua LARGE (*)    WBC, UA >50 (*)    Bacteria, UA RARE (*)    All other components within normal limits  URINE CULTURE  PREGNANCY, URINE  COMPREHENSIVE METABOLIC PANEL  CBC WITH DIFFERENTIAL/PLATELET    EKG None  Radiology DG Chest 2 View  Result Date: 10/26/2021 CLINICAL DATA:  Shortness of breath EXAM: CHEST - 2 VIEW COMPARISON:  09/23/2021 FINDINGS: Cardiac and mediastinal contours are within normal limits. No focal pulmonary opacity. No pleural effusion or pneumothorax. No acute osseous abnormality. IMPRESSION: No acute cardiopulmonary process. Electronically Signed   By: Wiliam Ke M.D.   On: 10/26/2021 19:21    Procedures Procedures    Medications Ordered in ED Medications  acetaminophen (TYLENOL) tablet 650 mg (650 mg Oral Given 10/28/21 0438)    ED Course/ Medical Decision Making/ A&P                           Medical Decision Making Amount and/or Complexity of Data Reviewed Labs: ordered.  Risk OTC drugs. Prescription drug management.   Patient presents to the ED with complaints of back/lower abdominal discomfort, this involves an extensive number of treatment options, and is a complaint that carries with it a high risk of complications and morbidity. Nontoxic, vitals w/ elevated BP- doubt HTN emergency.   Additional history obtained:  Chart/nursing notes reviewed  Lab Tests:  I viewed & interpreted labs including:  Pregnancy test: Negative CBC: Mild thrombocytosis,  otherwise unremarkable CMP: Hypokalemia which will be orally replaced in the ED with prescription sent home with her. Urinalysis: Findings somewhat concerning for infection, also has some findings of her likely onset of menstrual cycle.  ED Course:  I ordered medications including Tylenol for pain  06:00: RE-EVAL: Patient reports pain has resolved, repeat abdominal exam remains without peritoneal signs.   Patient not pregnant therefore doubt ectopic pregnancy or pain related to pregnancy. Not currently sexually active, when discussing a pelvic exam she reports that she had a Pap smear yesterday that was not uncomfortable for her, we discussed option of repeat in the ED, shared decision making patient declined, I have a  low suspicion for pelvic inflammatory disease.  No abnormal vaginal discharge.  Urine with some findings concerning for infection, given her back/pelvic discomfort and urinary frequency will treat with antibiotics.  Suspect this may be combination of UTI as well as her menstrual cycle coming on.  She is currently pain-free.  Remains without peritoneal signs therefore do not suspect acute surgical abdomen.  She overall seems reasonable for discharge home with supportive care.  I discussed results, treatment plan, need for follow-up, return cautions the patient and her mom at bedside.  Provided opportunity for questions, they have confirmed understanding and are in agreement with plan.   Portions of this note were generated with L[employer]. Dictation errors may occur despite best attempts at proofreading.   Final Clinical Impression(s) / ED Diagnoses Final diagnoses:  Acute bilateral low back pain without sciatica  Hypokalemia    Rx / DC Orders ED Discharge Orders          Ordered    nitrofurantoin, macrocrystal-monohydrate, (MACROBID) 100 MG capsule  2 times daily        10/28/21 0612    naproxen (NAPROSYN) 500 MG tablet  2 times daily PRN        10/28/21  0612    potassium chloride SA (KLOR-CON M) 20 MEQ tablet  Daily        10/28/21 0612              Amaryllis Dyke, PA-C 50/27/74 1287    Delora Fuel, MD 86/76/72 [phone]

## 2021-10-28 NOTE — ED Triage Notes (Signed)
Pt reports with lower back pain and pelvic pain that started Friday.

## 2021-10-28 NOTE — Discharge Instructions (Addendum)
You were seen in the emergency department today for back pain and pelvic pain.  Your work-up was overall reassuring.  Your urine had some findings of infection therefore we are treating this with Macrobid which is an antibiotic.  Please take this as prescribed.  Also sent you home with potassium as your potassium level was low, please include potassium rich foods in your diet and have this rechecked by your primary care provider within 1 week.  We are sending you with naproxen to take every 12 hours as needed for pain.  Take with food as this can cause stomach upset and it were stomach bleeding, do not take other NSAIDs with this medicine such as Motrin, ibuprofen, Advil, Aleve, Goody powder, etc. these are similar.  You may take Tylenol per over-the-counter dosing with these medicines.  We have prescribed you new medication(s) today. Discuss the medications prescribed today with your pharmacist as they can have adverse effects and interactions with your other medicines including over the counter and prescribed medications. Seek medical evaluation if you start to experience new or abnormal symptoms after taking one of these medicines, seek care immediately if you start to experience difficulty breathing, feeling of your throat closing, facial swelling, or rash as these could be indications of a more serious allergic reaction  Follow-up with primary care within 1 week.  Return to the emergency department for new or worsening symptoms including but not limited to new or worsening pain, fever, inability to keep fluids down, trouble walking, passing out, or any other concerns.

## 2021-10-29 LAB — URINE CULTURE: Culture: 10000 — AB

## 2021-10-30 ENCOUNTER — Encounter: Payer: Self-pay | Admitting: Internal Medicine

## 2021-10-30 LAB — CYTOLOGY - PAP: Diagnosis: NEGATIVE

## 2021-11-10 ENCOUNTER — Telehealth (INDEPENDENT_AMBULATORY_CARE_PROVIDER_SITE_OTHER): Payer: 59 | Admitting: Psychiatry

## 2021-11-10 DIAGNOSIS — F4010 Social phobia, unspecified: Secondary | ICD-10-CM

## 2021-11-10 DIAGNOSIS — F418 Other specified anxiety disorders: Secondary | ICD-10-CM | POA: Diagnosis not present

## 2021-11-10 DIAGNOSIS — F341 Dysthymic disorder: Secondary | ICD-10-CM

## 2021-11-10 DIAGNOSIS — F4321 Adjustment disorder with depressed mood: Secondary | ICD-10-CM

## 2021-11-12 ENCOUNTER — Encounter (HOSPITAL_COMMUNITY): Payer: Self-pay | Admitting: Psychiatry

## 2021-11-12 MED ORDER — BUSPIRONE HCL 5 MG PO TABS: ORAL_TABLET | ORAL | 1 refills | Status: DC

## 2021-11-12 MED ORDER — SERTRALINE HCL 50 MG PO TABS: ORAL_TABLET | ORAL | 0 refills | Status: DC

## 2021-11-12 NOTE — Progress Notes (Signed)
[place] Follow up visit   Patient Identification: Sarah Hart MRN:  [id number] Date of Evaluation:  11/12/2021 Referral Source: primary care Chief Complaint:   follow up depression, anxiety  Visit Diagnosis:    ICD-10-CM   1. Social anxiety disorder  F40.10     2. 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 Dysthymia  F34.1     4. Grief  F43.21     Video didn't work so did Publishing copy Visit via Telephone Note  I connected with [name] on 11/12/21 at 12:30 PM EDT by telephone and verified that I am speaking with the correct person using two identifiers.  Location: Patient: home Provider: home office   I discussed the limitations, risks, security and privacy concerns of performing an evaluation and management service by telephone and the availability of in person appointments. I also discussed with the patient that there may be a patient responsible charge related to this service. The patient expressed understanding and agreed to proceed.       I discussed the assessment and treatment plan with the patient. The patient was provided an opportunity to ask questions and all were answered. The patient agreed with the plan and demonstrated an understanding of the instructions.   The patient was advised to call back or seek an in-person evaluation if the symptoms worsen or if the condition fails to improve as anticipated.  I provided 15 minutes of non-face-to-face time during this encounter.    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, but plan to change job Going thru grief due to loosing UNcle, gets anxious or somewhat down,  Wellbutrin made her agitated so stopped  She sees [place] at [facility] for therapy   Denies using drugs or alcohol  Aggravating  factor: performance anxiety, Modifying factor:family , ffriends  Duration since young age  No prior psych admission or suicide attempt Severity : feels if attention needs to be helped with meds  Past Psychiatric History: anxiety  Previous Psychotropic Medications: Yes    Past Medical History:  Past Medical History:  Diagnosis Date   Hypothyroidism    No past surgical history on file.  Family Psychiatric History: denies  Family History:  Family History  Problem Relation Age of Onset   Hypertension Mother    Breast cancer Mother        Mother was dx in 2009   Hypertension Father    Multiple myeloma Father 72   Asthma Brother    Pancreatic cancer Maternal Grandmother    Prostate cancer Maternal Grandfather     Social History:   Social History   Socioeconomic History   Marital status: Single    Spouse name: Not on file   Number of children: Not on file   Years of education: Not on file   Highest education level: Not on file  Occupational History   Not on file  Tobacco Use   Smoking status: Never   Smokeless tobacco: Never  Vaping Use   Vaping Use: Never used  Substance and Sexual Activity   Alcohol use: Not Currently    Comment: once or twice a month   Drug use: No   Sexual activity: Never  Other Topics Concern   Not on file  Social History Narrative   Not on  file   Social Determinants of Health   Financial Resource Strain: Not on file  Food Insecurity: Not on file  Transportation Needs: Not on file  Physical Activity: Not on file  Stress: Not on file  Social Connections: Not on file      Allergies:  No Known Allergies  Metabolic Disorder Labs: No results found for: "HGBA1C", "MPG" No results found for: "PROLACTIN" No results found for: "CHOL", "TRIG", "HDL", "CHOLHDL", "VLDL", "LDLCALC" Lab Results  Component Value Date   TSH 1.222 10/26/2021    Therapeutic Level Labs: No results found for: "LITHIUM" No results found for: "CBMZ" No  results found for: "VALPROATE"  Current Medications: Current Outpatient Medications  Medication Sig Dispense Refill   buPROPion (WELLBUTRIN SR) 200 MG 12 hr tablet TAKE 1 TABLET(200 MG) BY MOUTH DAILY 30 tablet 1   busPIRone (BUSPAR) 5 MG tablet TAKE 1 TABLET(5 MG) BY MOUTH TWICE DAILY 60 tablet 1   naproxen (NAPROSYN) 500 MG tablet Take 1 tablet (500 mg total) by mouth 2 (two) times daily as needed. 15 tablet 0   nitrofurantoin, macrocrystal-monohydrate, (MACROBID) 100 MG capsule Take 1 capsule (100 mg total) by mouth 2 (two) times daily. 10 capsule 0   potassium chloride SA (KLOR-CON M) 20 MEQ tablet Take 1 tablet (20 mEq total) by mouth daily. 4 tablet 0   propranolol (INDERAL) 10 MG tablet Take 1 tablet (10 mg total) by mouth daily. 90 tablet 0   SYNTHROID 50 MCG tablet TAKE 1 TABLET(50 MCG) BY MOUTH DAILY 90 tablet 2   valACYclovir (VALTREX) 1000 MG tablet Take 1 tablet (1,000 mg total) by mouth 3 (three) times daily. (Patient not taking: Reported on 10/28/2021) 21 tablet 3   No current facility-administered medications for this visit.      Psychiatric Specialty Exam: Review of Systems  Cardiovascular:  Negative for chest pain.  Skin:  Negative for rash.  Psychiatric/Behavioral:  Negative for agitation and self-injury. The patient is not nervous/anxious.     Last menstrual period 09/28/2021.There is no height or weight on file to calculate BMI.  General Appearance: Casual  Eye Contact:  Fair  Speech:  Normal Rate  Volume:  Normal  Mood: fair but subdued  Affect:  Congruent  Thought Process:  Goal Directed  Orientation:  Full (Time, Place, and Person)  Thought Content:  Rumination  Suicidal Thoughts:  No  Homicidal Thoughts:  No  Memory:  Immediate;   Fair Recent;   Fair  Judgement:  Fair  Insight:  Fair  Psychomotor Activity:  Decreased  Concentration:  Concentration: Fair and Attention Span: Fair  Recall:  [employer] of Knowledge:Good  Language: Good  Akathisia:   No  Handed:    AIMS (if indicated):  not done  Assets:  Desire for Improvement Financial Resources/Insurance Housing Social Support  ADL's:  Intact  Cognition: WNL  Sleep:  Fair   Screenings: GAD-7    Personnel officer Visit from 10/27/2021 in [place] Office Visit from 09/25/2021 in [place] Office Visit from 06/17/2020 in [place] from 02/18/2020 in [place] Office Visit from 08/14/2019 in [place]  Total GAD-7 Score _0 PHQ2-9    [place] Office Visit from 10/27/2021 in [place] Office Visit from 09/25/2021 in [place]  Office Visit from 06/19/2021 in [place] Office Visit from 06/17/2020 in [place] Video Visit from 04/18/2020 in [place]  PHQ-2 Total Score _0 PHQ-9 Total Score _1 --      Flowsheet Row Video Visit from 11/10/2021 in [place] ED from 10/27/2021 in [place] DEPT ED from 10/26/2021 in [place] DEPT  C-SSRS RISK CATEGORY No Risk No Risk No Risk       Assessment and Plan: as follows Prior documentation reviewed  Social anxiety disorder; fair takes buspar bid  Performance anxiety: buspar helps , will continue and add zoloft ADHD; possible  wellbutrin made her anxious, she stopped Depression; gets subdued, zoloft start small dose and increse to 82m for depression, anxiety and inattention, provided supportive therapy Fu 6 weeks  NMerian Capron MD 10/29/20233:49 PM

## 2021-11-22 ENCOUNTER — Other Ambulatory Visit: Payer: Self-pay | Admitting: Internal Medicine

## 2021-11-22 DIAGNOSIS — R102 Pelvic and perineal pain: Secondary | ICD-10-CM

## 2021-12-04 ENCOUNTER — Encounter: Payer: Self-pay | Admitting: Radiology

## 2021-12-04 ENCOUNTER — Ambulatory Visit: Payer: Self-pay | Attending: Family Medicine | Admitting: Pharmacist

## 2021-12-04 ENCOUNTER — Ambulatory Visit (INDEPENDENT_AMBULATORY_CARE_PROVIDER_SITE_OTHER): Payer: 59 | Admitting: Radiology

## 2021-12-04 VITALS — BP 118/78 | Temp 97.9°F | Ht 65.0 in | Wt 227.0 lb

## 2021-12-04 VITALS — BP 130/79 | HR 91

## 2021-12-04 DIAGNOSIS — R102 Pelvic and perineal pain: Secondary | ICD-10-CM | POA: Diagnosis not present

## 2021-12-04 DIAGNOSIS — R030 Elevated blood-pressure reading, without diagnosis of hypertension: Secondary | ICD-10-CM

## 2021-12-04 DIAGNOSIS — D72829 Elevated white blood cell count, unspecified: Secondary | ICD-10-CM

## 2021-12-04 NOTE — Progress Notes (Signed)
   S:    No chief complaint on file.  26 y.o. female who presents for hypertension evaluation, education, and management.  PMH is significant for anxiety, hypothyroidism, and allergic rhinitis.  Patient was referred and last seen by Primary Care Provider, Dr. Laural Benes, on 10/27/2021.   At last visit, BP was 127/85 and it was recommended that she purchase  BP machine and follow up with pharmacy for a BP check.   Today, patient arrives in spirits and presents without assistance. Denies dizziness, headache, blurred vision, swelling. Usually uses propranolol ~1x/week for anxiety. Recently switched from Wellbutrin to sertraline as Wellbutrin was worsening anxiety. She reports improved anxiety symptoms since started sertraline. Of note, manual BP taken at gynecologist today was 118/78.  Family/Social history: no family history of heart attack or stroke. Mother and uncle has HTN. No h/o kidney disease.   Medication adherence reported, not on any BP agents other than propranolol (see below).  Current antihypertensives include: propranolol (used for performance anxiety)  Antihypertensives tried in the past include: none  Reported home BP readings: 130s/80s; has validated BP cuff at home (Omron). Confirmed proper technique.   Patient reported dietary habits: restricts salt when cooking her own food but admits it's hard to limit salt intake when others are eating. Her meals typically consist of a protein, veggie, and carb.   Patient-reported exercise habits: admits this could improve.   O:   Last 3 Office BP readings: BP Readings from Last 3 Encounters:  12/04/21 130/79  12/04/21 118/78  10/28/21 (!) 133/92    BMET    Component Value Date/Time   NA 136 10/28/2021 0429   NA 141 08/21/2018 1547   K 3.1 (L) 10/28/2021 0429   CL 106 10/28/2021 0429   CO2 25 10/28/2021 0429   GLUCOSE 137 (H) 10/28/2021 0429   BUN 10 10/28/2021 0429   BUN 9 08/21/2018 1547   CREATININE 0.88 10/28/2021  0429   CALCIUM 9.0 10/28/2021 0429   GFRNONAA >60 10/28/2021 0429   GFRAA 124 08/21/2018 1547    A/P: Slightly elevated blood pressure at last PCP visit. Relatively unchanged today (130/79 mmHg). Manual BP taken at gynecologist today was 118/78. SBP at home also slightly elevated in the 130s. Anxiety could be attributing to elevated BP readings. She admits she could improve diet/exercise.  -Using shared clinical decision making, patient agreeable to continue to monitor her BP at home and bring a log to her PCP visit in a few weeks. Re-reviewed proper BP technique.  Would prefer to see BP after patient has been on sertraline for 4-6 weeks to see if anxiety symptoms improve.  -F/u labs ordered - CBC w/ diff per PCP.  Written patient instructions provided. Patient verbalized understanding of treatment plan.  Total time in face to face counseling 20 minutes.    Follow-up:  Pharmacist PRN. PCP clinic visit in December 2023.   Valeda Malm, Pharm.D. PGY-2 Ambulatory Care Pharmacy Resident 12/04/2021 4:11 PM

## 2021-12-04 NOTE — Progress Notes (Signed)
   Sarah Hart 1995/11/28 [id number]   History:  26 y.o. G0 presents as a new patient with c/o pelvic pain and pressures x 5 weeks. Was treated for a UTI and sx did not improve. Does have some d/c. Not sexually active currently. Pain alternates sides and is not constant. No changes to bowel or bladder habits.   Gynecologic History Patient's last menstrual period was 12/03/2021 (exact date). Period Duration (Days): 6 Period Pattern: (!) Irregular (regular x's 6 momths) Menstrual Flow: Moderate Menstrual Control: Maxi pad Dysmenorrhea: (!) Moderate Dysmenorrhea Symptoms: Other (Comment) (back pain radiates to thighs) Contraception/Family planning: abstinence Sexually active: yes   Obstetric History OB History  Gravida Para Term Preterm AB Living  0 0 0 0 0 0  SAB IAB Ectopic Multiple Live Births  0 0 0 0 0     The following portions of the patient's history were reviewed and updated as appropriate: allergies, current medications, past family history, past medical history, past social history, past surgical history, and problem list.  Review of Systems Pertinent items noted in HPI and remainder of comprehensive ROS otherwise negative.   Past medical history, past surgical history, family history and social history were all reviewed and documented in the EPIC chart.   Exam:  Vitals:   12/04/21 1317  BP: 118/78  Temp: 97.9 F (36.6 C)  TempSrc: Oral  Weight: 227 lb (103 kg)  Height: 5\' 5"  (1.651 m)   Body mass index is 37.77 kg/m.  General appearance:  Normal Respiratory  Auscultation:  Clear without wheezing or rhonchi Cardiovascular  Auscultation:  Regular rate, without rubs, murmurs or gallops  Edema/varicosities:  Not grossly evident Abdominal  Soft,nontender, without masses, guarding or rebound.  Liver/spleen:  No organomegaly noted  Hernia:  None appreciated  Skin  Inspection:  Grossly normal Genitourinary   Inguinal/mons:  Normal without inguinal  adenopathy  External genitalia:  Normal appearing vulva with no masses, tenderness, or lesions  BUS/Urethra/Skene's glands:  Normal without masses or exudate  Vagina:  Menstrual blood, no pooling.  Cervix:  Normal appearing without discharge or lesions  Uterus:  Normal in size, shape and contour.  Mobile, nontender  Adnexa/parametria:     Rt: Normal in size, without masses + tenderness   Lt: Normal in size, without masses + tenderness  Anus and perineum: Normal   Patient informed chaperone available to be present for breast and pelvic exam. Patient has requested no chaperone to be present. Patient has been advised what will be completed during breast and pelvic exam.   Assessment/Plan:   1. Pelvic pain - Pregnancy, urine - Urinalysis,Complete w/RFL Culture - Transvaginal Non-OB; Future - SureSwab Advanced Vaginitis Plus,TMA  Mitch Arquette B WHNP-BC 1:44 PM 12/04/2021

## 2021-12-05 LAB — CBC WITH DIFFERENTIAL/PLATELET
Basophils Absolute: 0 10*3/uL (ref 0.0–0.2)
Basos: 0 %
EOS (ABSOLUTE): 0.1 10*3/uL (ref 0.0–0.4)
Eos: 1 %
Hematocrit: 39.8 % (ref 34.0–46.6)
Hemoglobin: 12.7 g/dL (ref 11.1–15.9)
Immature Grans (Abs): 0 10*3/uL (ref 0.0–0.1)
Immature Granulocytes: 0 %
Lymphocytes Absolute: 2.6 10*3/uL (ref 0.7–3.1)
Lymphs: 24 %
MCH: 26.9 pg (ref 26.6–33.0)
MCHC: 31.9 g/dL (ref 31.5–35.7)
MCV: 84 fL (ref 79–97)
Monocytes Absolute: 0.5 10*3/uL (ref 0.1–0.9)
Monocytes: 4 %
Neutrophils Absolute: 8 10*3/uL — ABNORMAL HIGH (ref 1.4–7.0)
Neutrophils: 71 %
Platelets: 468 10*3/uL — ABNORMAL HIGH (ref 150–450)
RBC: 4.72 x10E6/uL (ref 3.77–5.28)
RDW: 13 % (ref 11.7–15.4)
WBC: 11.2 10*3/uL — ABNORMAL HIGH (ref 3.4–10.8)

## 2021-12-06 ENCOUNTER — Encounter: Payer: Self-pay | Admitting: Internal Medicine

## 2021-12-06 LAB — URINALYSIS, COMPLETE W/RFL CULTURE
Bilirubin Urine: NEGATIVE
Glucose, UA: NEGATIVE
Hyaline Cast: NONE SEEN /LPF
Ketones, ur: NEGATIVE
Nitrites, Initial: NEGATIVE
Protein, ur: NEGATIVE
Specific Gravity, Urine: 1.015 (ref 1.001–1.035)
pH: 6 (ref 5.0–8.0)

## 2021-12-06 LAB — SURESWAB® ADVANCED VAGINITIS PLUS,TMA
C. trachomatis RNA, TMA: NOT DETECTED
CANDIDA SPECIES: NOT DETECTED
Candida glabrata: NOT DETECTED
N. gonorrhoeae RNA, TMA: NOT DETECTED
SURESWAB(R) ADV BACTERIAL VAGINOSIS(BV),TMA: NEGATIVE
TRICHOMONAS VAGINALIS (TV),TMA: NOT DETECTED

## 2021-12-06 LAB — CULTURE INDICATED

## 2021-12-06 LAB — URINE CULTURE
MICRO NUMBER:: 14212291
SPECIMEN QUALITY:: ADEQUATE

## 2021-12-06 LAB — PREGNANCY, URINE: Preg Test, Ur: NEGATIVE

## 2021-12-12 ENCOUNTER — Emergency Department (HOSPITAL_BASED_OUTPATIENT_CLINIC_OR_DEPARTMENT_OTHER): Payer: 59

## 2021-12-12 ENCOUNTER — Encounter (HOSPITAL_BASED_OUTPATIENT_CLINIC_OR_DEPARTMENT_OTHER): Payer: Self-pay

## 2021-12-12 ENCOUNTER — Emergency Department (HOSPITAL_BASED_OUTPATIENT_CLINIC_OR_DEPARTMENT_OTHER)
Admission: EM | Admit: 2021-12-12 | Discharge: 2021-12-12 | Disposition: A | Discharge status: Home / Self Care | Payer: 59 | Attending: Emergency Medicine | Admitting: Emergency Medicine

## 2021-12-12 DIAGNOSIS — Z79899 Other long term (current) drug therapy: Secondary | ICD-10-CM | POA: Diagnosis not present

## 2021-12-12 DIAGNOSIS — M79604 Pain in right leg: Secondary | ICD-10-CM | POA: Insufficient documentation

## 2021-12-12 DIAGNOSIS — E039 Hypothyroidism, unspecified: Secondary | ICD-10-CM | POA: Diagnosis not present

## 2021-12-12 MED ORDER — IBUPROFEN 400 MG PO TABS
600.0000 mg | ORAL_TABLET | Freq: Once | ORAL | Status: AC
  Administered 2021-12-12: 600 mg via ORAL
  Filled 2021-12-12 (×2): qty 1

## 2021-12-12 NOTE — Discharge Instructions (Signed)
The work-up today was overall reassuring.  Negative for blood clot in the leg.  Recommend continued therapy outpatient with nonsteroidals in the form of ibuprofen/Aleve for the next 5 to 7 days.  Recommend reevaluation by primary care provider/orthopedic.  Please do not hesitate to return to emergency department if the worrisome signs and symptoms we discussed become apparent.

## 2021-12-12 NOTE — ED Triage Notes (Addendum)
C/o right posterior leg pain radiating down from buttocks. Last Tuesday had a pain that radiated down bilateral legs. Now having pain only in right leg since last night. Denies redness/swelling.

## 2021-12-12 NOTE — ED Provider Notes (Signed)
[facility] EMERGENCY DEPARTMENT Provider Note   CSN: [id number] Arrival date & time: 12/12/21  1813     History  Chief Complaint  Patient presents with   Leg Pain    Mariana Wiederholt is a 26 y.o. female.   Leg Pain   25 year old female presents emergency department with complaints of right leg pain.  Patient states the pain has been present intermittently since Wednesday of last week.  She reports pain located in her right gluteal region with radiation down posterior right thigh.  Pain is worse with certain movements and relieved with rest.  She is taken Tylenol at home which has resolved her symptoms in the past.  She also reports feelings of tingling/numbness upon awakening and sitting in certain positions.  Said symptoms are relieved when she stands up and ambulates.  Denies any known trauma to affected extremity.  Denies any weakness to affected extremity.  Denies fever, chills, night sweats, known rash/lower extremity swelling, chest pain, shortness of breath.  Patient patient is requesting DVT ultrasound rule out due to concern.  Denies history of DVT/PE, recent surgery/immobilization, hormonal therapy, known malignancy, known coagulopathy.  She states she did drive in a car 3 hours over the holidays but otherwise has not been sedentary.  Past medical history significant for anxiety, hypothyroidism  Home Medications Prior to Admission medications   Medication Sig Start Date End Date Taking? Authorizing Provider  busPIRone (BUSPAR) 5 MG tablet TAKE 1 TABLET(5 MG) BY MOUTH TWICE DAILY 11/12/21   Thresa Ross, MD  fluticasone (FLONASE) 50 MCG/ACT nasal spray Place into both nostrils daily.    [provider]  naproxen (NAPROSYN) 500 MG tablet Take 1 tablet (500 mg total) by mouth 2 (two) times daily as needed. Patient not taking: Reported on 12/04/2021 10/28/21   Petrucelli, Pleas Koch, PA-C  propranolol (INDERAL) 10 MG tablet Take 1 tablet (10 mg total) by  mouth daily. 08/08/21   Thresa Ross, MD  sertraline (ZOLOFT) 50 MG tablet TAKE 1/2 TABLET BY MOUTH DAILY FOR 2 TO 3 WEEKS THEN TAKE 1 TABLET BY MOUTH DAILY 11/12/21   Thresa Ross, MD  SYNTHROID 50 MCG tablet TAKE 1 TABLET(50 MCG) BY MOUTH DAILY 09/15/20   Marcine Matar, MD  valACYclovir (VALTREX) 1000 MG tablet Take 1 tablet (1,000 mg total) by mouth 3 (three) times daily. Patient not taking: Reported on 10/28/2021 06/19/21   Marcine Matar, MD      Allergies    Patient has no known allergies.    Review of Systems   Review of Systems  All other systems reviewed and are negative.   Physical Exam Updated Vital Signs BP 128/84 (BP Location: Left Arm)   Pulse 91   Temp 98.7 F (37.1 C)   Resp 18   Ht 5\' 5"  (1.651 m)   Wt 102.1 kg   LMP 12/03/2021 (Exact Date)   SpO2 100%   BMI 37.44 kg/m  Physical Exam Vitals and nursing note reviewed.  Constitutional:      General: She is not in acute distress.    Appearance: She is well-developed.  HENT:     Head: Normocephalic and atraumatic.  Eyes:     Conjunctiva/sclera: Conjunctivae normal.  Cardiovascular:     Rate and Rhythm: Normal rate and regular rhythm.     Heart sounds: No murmur heard. Pulmonary:     Effort: Pulmonary effort is normal. No respiratory distress.     Breath sounds: Normal breath sounds.  Abdominal:  Palpations: Abdomen is soft.     Tenderness: There is no abdominal tenderness.  Musculoskeletal:        General: No swelling.     Cervical back: Neck supple.     Comments: No midline tenderness of cervical, thoracic, lumbar spine with no obvious step-off or deformity noted.  No paraspinal tenderness in the lumbar region.  Patient has mild tenderness palpation right gluteal region.  No tenderness palpation of right or left hip.  No lower extremity swelling noted.  No overlying skin abnormalities noted.  Pedal pulses full intact bilaterally.  Patient has full active range of motion of bilateral hips,  knees, ankles, digits.  Strength 5 out of 5 for hip flexion, extension, knee flexion/extension, ankle dorsi/plantarflexion.  Patient complaining no sensory deficits along major nerve distributions of lower extremities.  Straight leg raise negative bilaterally.  She did elicit bilateral equal sensations of stretching type sensation with no sharp radiation of pain.  Skin:    General: Skin is warm and dry.     Capillary Refill: Capillary refill takes less than 2 seconds.  Neurological:     Mental Status: She is alert.  Psychiatric:        Mood and Affect: Mood normal.     ED Results / Procedures / Treatments   Labs (all labs ordered are listed, but only abnormal results are displayed) Labs Reviewed - No data to display  EKG None  Radiology US Venous Img Lower Unilateral Right  Result Date: 12/12/2021 CLINICAL DATA:  Right lower extremity pain. EXAM: RIGHT LOWER EXTREMITY VENOUS DOPPLER ULTRASOUND TECHNIQUE: Gray-scale sonography with compression, as well as color and duplex ultrasound, were performed to evaluate the deep venous system(s) from the level of the common femoral vein through the popliteal and proximal calf veins. COMPARISON:  None Available. FINDINGS: VENOUS Normal compressibility of the common femoral, superficial femoral, and popliteal veins, as well as the visualized calf veins. Visualized portions of profunda femoral vein and great saphenous vein unremarkable. No filling defects to suggest DVT on grayscale or color Doppler imaging. Doppler waveforms show normal direction of venous flow, normal respiratory plasticity and response to augmentation. Limited views of the contralateral common femoral vein are unremarkable. OTHER None. Limitations: none IMPRESSION: Negative. Electronically Signed   By: Aram Candela M.D.   On: 12/12/2021 22:21    Procedures Procedures    Medications Ordered in ED Medications  ibuprofen (ADVIL) tablet 600 mg (600 mg Oral Given 12/12/21 2117)     ED Course/ Medical Decision Making/ A&P Clinical Course as of 12/12/21 2249  Tue Dec 12, 2021  2048 Wed. Wrose with sitting/ laying down.  [CR]    Clinical Course User Index [CR] Peter Garter, PA                           Medical Decision Making  This patient presents to the ED for concern of leg pain, this involves an extensive number of treatment options, and is a complaint that carries with it a high risk of complications and morbidity.  The differential diagnosis includes fracture, strain sprain, dislocation, DVT, PAD, sciatica, peripheral neuropathy, compartment syndrome, osteoarthritis, rheumatoid rest, gout, septic arthritis   Co morbidities that complicate the patient evaluation  See HPI   Additional history obtained:  Additional history obtained from EMR External records from outside source obtained and reviewed including hospital records   Lab Tests:  N/a   Imaging Studies ordered:  I  ordered imaging studies including lower extremity ultrasound I independently visualized and interpreted imaging which showed negative for DVT I agree with the radiologist interpretation   Cardiac Monitoring: / EKG:  The patient was maintained on a cardiac monitor.  I personally viewed and interpreted the cardiac monitored which showed an underlying rhythm of: Sinus rhythm   Consultations Obtained:  N/a   Problem List / ED Course / Critical interventions / Medication management  Leg pain I ordered medication including Advil for pain   Reevaluation of the patient after these medicines showed that the patient improved I have reviewed the patients home medicines and have made adjustments as needed   Social Determinants of Health:  Denies tobacco, illicit drug use   Test / Admission - Considered:  Right leg pain Vitals signs  within normal range and stable throughout visit. Laboratory/imaging studies significant for: See above Patient symptoms likely  secondary to muscular etiology given area of tenderness without evidence of trauma.  No definitive sciatica type symptoms.  Pain significantly improved with administration of nonsteroidals.  Doubt PAD/DVT.  Doubt osteoarthritis, rheumatoid eyes, gout, septic arthritis.  Patient recommended to continue symptomatic therapy outpatient with NSAIDs in the form of ibuprofen, rest, ice, elevation of affected extremity.  Follow-up with orthopedics recommended in 7 to 10 days.  Treatment plan discussed with patient she melitracen was agreeable to said plan. Worrisome signs and symptoms were discussed with the patient, and the patient acknowledged understanding to return to the ED if noticed. Patient was stable upon discharge.          Final Clinical Impression(s) / ED Diagnoses Final diagnoses:  Right leg pain    Rx / DC Orders ED Discharge Orders     None         Peter Garter, [place] 12/12/21 2249    Terrilee Files, MD 12/13/21 1112

## 2021-12-15 ENCOUNTER — Encounter: Payer: Self-pay | Admitting: Internal Medicine

## 2021-12-18 ENCOUNTER — Telehealth (HOSPITAL_COMMUNITY): Payer: Self-pay | Admitting: *Deleted

## 2021-12-18 MED ORDER — SERTRALINE HCL 50 MG PO TABS: ORAL_TABLET | ORAL | 0 refills | Status: DC

## 2021-12-18 NOTE — Telephone Encounter (Signed)
Rx REFILL REQUEST -- sertraline (ZOLOFT) 50 MG tablet

## 2021-12-18 NOTE — Addendum Note (Signed)
Addended by: Thresa Ross on: 12/18/2021 09:55 AM   Modules accepted: Orders

## 2021-12-19 ENCOUNTER — Ambulatory Visit: Payer: 59 | Attending: Internal Medicine | Admitting: Internal Medicine

## 2021-12-19 ENCOUNTER — Encounter: Payer: Self-pay | Admitting: Internal Medicine

## 2021-12-19 VITALS — BP 135/84 | HR 90 | Temp 98.2°F | Ht 65.0 in | Wt 224.0 lb

## 2021-12-19 DIAGNOSIS — D72829 Elevated white blood cell count, unspecified: Secondary | ICD-10-CM | POA: Diagnosis not present

## 2021-12-19 DIAGNOSIS — R030 Elevated blood-pressure reading, without diagnosis of hypertension: Secondary | ICD-10-CM

## 2021-12-19 DIAGNOSIS — F411 Generalized anxiety disorder: Secondary | ICD-10-CM | POA: Diagnosis not present

## 2021-12-19 DIAGNOSIS — Z2821 Immunization not carried out because of patient refusal: Secondary | ICD-10-CM

## 2021-12-19 DIAGNOSIS — M5432 Sciatica, left side: Secondary | ICD-10-CM

## 2021-12-19 DIAGNOSIS — M5431 Sciatica, right side: Secondary | ICD-10-CM | POA: Diagnosis not present

## 2021-12-19 DIAGNOSIS — D75839 Thrombocytosis, unspecified: Secondary | ICD-10-CM

## 2021-12-19 DIAGNOSIS — E669 Obesity, unspecified: Secondary | ICD-10-CM

## 2021-12-19 NOTE — Progress Notes (Signed)
Patient ID: Sarah Hart, female    DOB: Feb 16, 1995  MRN: [id number]  CC: Hospitalization Follow-up [facility] f/u. Med refill. /Intermittent dull leg pain X1 week. /Not to flu vax.)   Subjective: Rickey Farrier is a 26 y.o. female who presents for ER f/u Her concerns today include:  Hx of hypothyroid, fhx of breast CA in mother, obesity, anxiety   Seen in ER 12/13/2021 with symptoms of RT sided sciatica Since then she gets symptoms but sometimes it also occurs on LT side Tries to do stretches and takes Ibuprofen PRN.  Worse when trying to sleep at nights but better since ER visit Had sent me a message over the wkend requesting something for sleep/anxiety stating that the dull pains/aches in her legs makes it hard for her to relax and sleep at night.  I told her that I can prescribe a muscle relaxant.  Patient decided to hold off on a muscle relaxant and discussed being placed on hydroxyzine when she sees the psychiatrist next week.  Anx:  saw new psychiatrist 11/10/21 Stopped Wellbutrin and started on low dose Zoloft 25 mg instead. Dose increased to 50 mg after 2 wks Caused tiredness, more anx and dep and lack of appetite initially.  Symptoms have resolved.  Feels better on med now.  Anx not as intense.  Has follow-up with his psychiatrist next week  Pt has intermittent elev WBC/PLT Last CBC showed slight elevation in WBC of 11.  Platelet count has varied between 459-475.  Reports that she was having upper respiratory infection at the time of her last blood draw on 12/04/2021.  She wonders whether that may have caused the elevations.  We had plan to recheck her CBC today.  BP:  saw clin phar and GYN same day on 12/04/2021.  Blood pressure was good. BP home readings SBP 128-130/does not recall DBP  Obesity: Doing better with eating habits.  Cooking more at home.  Eating more fruits and vegetables.  She has started walking and plans to start walk 30 minutes several days a week.     Patient Active Problem List   Diagnosis Date Noted   Family history of breast cancer in mother 12/20/2017   Irregular menses 07/23/2017   Class 3 severe obesity without serious comorbidity with body mass index (BMI) of 40.0 to 44.9 in adult [facility]) 07/23/2017   Acquired hypothyroidism 02/08/2017   Allergic rhinitis 02/08/2017     Current Outpatient Medications on File Prior to Visit  Medication Sig Dispense Refill   busPIRone (BUSPAR) 5 MG tablet TAKE 1 TABLET(5 MG) BY MOUTH TWICE DAILY 60 tablet 1   fluticasone (FLONASE) 50 MCG/ACT nasal spray Place into both nostrils daily.     propranolol (INDERAL) 10 MG tablet Take 1 tablet (10 mg total) by mouth daily. 90 tablet 0   sertraline (ZOLOFT) 50 MG tablet TAKE 1 TABLET BY MOUTH DAILY 30 tablet 0   SYNTHROID 50 MCG tablet TAKE 1 TABLET(50 MCG) BY MOUTH DAILY 90 tablet 2   valACYclovir (VALTREX) 1000 MG tablet Take 1 tablet (1,000 mg total) by mouth 3 (three) times daily. 21 tablet 3   No current facility-administered medications on file prior to visit.    No Known Allergies  Social History   Socioeconomic History   Marital status: Single    Spouse name: Not on file   Number of children: Not on file   Years of education: Not on file   Highest education level: Not on file  Occupational  History   Not on file  Tobacco Use   Smoking status: Never    Passive exposure: Never   Smokeless tobacco: Never  Vaping Use   Vaping Use: Never used  Substance and Sexual Activity   Alcohol use: Not Currently    Comment: once or twice a month   Drug use: No   Sexual activity: Never    Partners: Male    Comment: menarche 84yo  Other Topics Concern   Not on file  Social History Narrative   Not on file   Social Determinants of Health   Financial Resource Strain: Not on file  Food Insecurity: Not on file  Transportation Needs: Not on file  Physical Activity: Not on file  Stress: Not on file  Social Connections: Not on file  Intimate  Partner Violence: Not on file    Family History  Problem Relation Age of Onset   Hypertension Mother    Breast cancer Mother        Mother was dx in 2009   Hypertension Father    Multiple myeloma Father 88   Asthma Brother    Pancreatic cancer Maternal Grandmother    Prostate cancer Maternal Grandfather     No past surgical history on file.  ROS: Review of Systems Negative except as stated above  PHYSICAL EXAM: BP 135/84 (BP Location: Left Arm, Patient Position: Sitting, Cuff Size: Normal)   Pulse 90   Temp 98.2 F (36.8 C) (Oral)   Ht _0  (1.651 m)   Wt 224 lb (101.6 kg)   LMP 12/03/2021 (Exact Date)   SpO2 98%   BMI 37.28 kg/m   Wt Readings from Last 3 Encounters:  12/19/21 224 lb (101.6 kg)  12/12/21 225 lb (102.1 kg)  12/04/21 227 lb (103 kg)    Physical Exam 125/90 General appearance - alert, well appearing, and in no distress Mental status - normal mood, behavior, speech, dress, motor activity, and thought processes Neck - supple, no significant adenopathy Chest - clear to auscultation, no wheezes, rales or rhonchi, symmetric air entry Heart - normal rate, regular rhythm, normal S1, S2, no murmurs, rubs, clicks or gallops Extremities - peripheral pulses normal, no pedal edema, no clubbing or cyanosis      Latest Ref Rng & Units 10/28/2021    4:29 AM 10/26/2021    7:06 PM 09/29/2021   11:57 AM  CMP  Glucose 70 - 99 mg/dL 137  101    BUN 6 - 20 mg/dL 10  9    Creatinine 0.44 - 1.00 mg/dL 0.88  0.82    Sodium 135 - 145 mmol/L 136  139    Potassium 3.5 - 5.1 mmol/L 3.1  3.7  4.4   Chloride 98 - 111 mmol/L 106  107    CO2 22 - 32 mmol/L 25  25    Calcium 8.9 - 10.3 mg/dL 9.0  9.5    Total Protein 6.5 - 8.1 g/dL 7.9  8.5    Total Bilirubin 0.3 - 1.2 mg/dL 0.8  0.9    Alkaline Phos 38 - 126 U/L 91  99    AST 15 - 41 U/L 15  18    ALT 0 - 44 U/L 22  29     Lipid Panel  No results found for: "CHOL", "TRIG", "HDL", "CHOLHDL", "VLDL", "LDLCALC",  "LDLDIRECT"  CBC    Component Value Date/Time   WBC 11.2 (H) 12/04/2021 1515   WBC 10.1 10/28/2021 0429   RBC  4.72 12/04/2021 1515   RBC 4.61 10/28/2021 0429   HGB 12.7 12/04/2021 1515   HCT 39.8 12/04/2021 1515   PLT 468 (H) 12/04/2021 1515   MCV 84 12/04/2021 1515   MCH 26.9 12/04/2021 1515   MCH 27.3 10/28/2021 0429   MCHC 31.9 12/04/2021 1515   MCHC 32.0 10/28/2021 0429   RDW 13.0 12/04/2021 1515   LYMPHSABS 2.6 12/04/2021 1515   MONOABS 0.7 10/28/2021 0429   EOSABS 0.1 12/04/2021 1515   BASOSABS 0.0 12/04/2021 1515    ASSESSMENT AND PLAN:  1. Bilateral sciatica Improved.  She will continue using ibuprofen as needed. Advised to avoid heavy lifting or excessive pushing/pulling.  2. Elevated blood pressure reading in office without diagnosis of hypertension Continue to monitor at home.  Normal blood pressure being 120/80 or lower.  3. GAD (generalized anxiety disorder) Better on Zoloft.  She is plugged into behavioral health services.  4. Leukocytosis, unspecified type - CBC With Diff/Platelet  5. Thrombocytosis If this does not resolve, we will refer to hematology - CBC With Diff/Platelet  6. Obesity (BMI 35.0-39.9 without comorbidity) Commended her on changes that she has made in her eating habits so far.  Encouraged her to move forward with her plans to be more active by walking several days a week.  7. Influenza vaccination declined   Patient was given the opportunity to ask questions.  Patient verbalized understanding of the plan and was able to repeat key elements of the plan.   This documentation was completed using Radio producer.  Any transcriptional errors are unintentional.  No orders of the defined types were placed in this encounter.    Requested Prescriptions    No prescriptions requested or ordered in this encounter    No follow-ups on file.  Karle Plumber, MD, FACP

## 2021-12-20 LAB — CBC WITH DIFF/PLATELET
Basophils Absolute: 0 10*3/uL (ref 0.0–0.2)
Basos: 1 %
EOS (ABSOLUTE): 0.2 10*3/uL (ref 0.0–0.4)
Eos: 2 %
Hematocrit: 39.8 % (ref 34.0–46.6)
Hemoglobin: 12.8 g/dL (ref 11.1–15.9)
Immature Grans (Abs): 0 10*3/uL (ref 0.0–0.1)
Immature Granulocytes: 0 %
Lymphocytes Absolute: 2.3 10*3/uL (ref 0.7–3.1)
Lymphs: 33 %
MCH: 27.5 pg (ref 26.6–33.0)
MCHC: 32.2 g/dL (ref 31.5–35.7)
MCV: 86 fL (ref 79–97)
Monocytes Absolute: 0.4 10*3/uL (ref 0.1–0.9)
Monocytes: 5 %
Neutrophils Absolute: 4.1 10*3/uL (ref 1.4–7.0)
Neutrophils: 59 %
Platelets: 468 10*3/uL — ABNORMAL HIGH (ref 150–450)
RBC: 4.65 x10E6/uL (ref 3.77–5.28)
RDW: 13.1 % (ref 11.7–15.4)
WBC: 7 10*3/uL (ref 3.4–10.8)

## 2021-12-27 ENCOUNTER — Ambulatory Visit (INDEPENDENT_AMBULATORY_CARE_PROVIDER_SITE_OTHER): Payer: 59

## 2021-12-27 ENCOUNTER — Ambulatory Visit (INDEPENDENT_AMBULATORY_CARE_PROVIDER_SITE_OTHER): Payer: 59 | Admitting: Radiology

## 2021-12-27 DIAGNOSIS — R102 Pelvic and perineal pain: Secondary | ICD-10-CM

## 2021-12-27 DIAGNOSIS — N838 Other noninflammatory disorders of ovary, fallopian tube and broad ligament: Secondary | ICD-10-CM | POA: Diagnosis not present

## 2021-12-27 NOTE — Progress Notes (Signed)
   Sarah Hart [birth year] [id number]   History:  26 y.o. G0 presents for ultrasound follow up for c/o pelvic pain and pressures x 6 weeks. Was treated for a UTI and sx did not improve. Does have some d/c. Not sexually active currently. Pain alternates sides and is not constant. No changes to bowel or bladder habits.   Gynecologic History Patient's last menstrual period was 12/03/2021 (exact date).   Contraception/Family planning: abstinence Sexually active: yes   Obstetric History OB History  Gravida Para Term Preterm AB Living  0 0 0 0 0 0  SAB IAB Ectopic Multiple Live Births  0 0 0 0 0     The following portions of the patient's history were reviewed and updated as appropriate: allergies, current medications, past family history, past medical history, past social history, past surgical history, and problem list.  Review of Systems Pertinent items noted in HPI and remainder of comprehensive ROS otherwise negative.   Past medical history, past surgical history, family history and social history were all reviewed and documented in the EPIC chart.   Exam: Narrative & Impression Indication: pelvic pain   Anteverted uterus, normal size and shape without myometrial masses   Symmetrical endometrial 5.27mm   No masses or thickening seen    Both ovaries mobile, normal size with normal follicle pattern and normal perfusion 1.7 x 1.6 cm simple cystic structure in the right adnexa separate from ovary ?paratubal cyst   No free fluid   Impression: right paratubal cyst         Exam Ended: 12/27/21 08:31 Last Resulted: 12/27/21 09:00        Assessment/Plan:   1. Paratubal cyst Discussed management options including hormonal management or expectant management, pt prefers to monitor symptoms. Will let us know if she changes her mind and would like to try OCPs x 3 months. Warning signs reviewed.  Arlie Solomons B WHNP-BC 9:06 AM 12/27/2021

## 2021-12-29 ENCOUNTER — Telehealth (INDEPENDENT_AMBULATORY_CARE_PROVIDER_SITE_OTHER): Payer: 59 | Admitting: Psychiatry

## 2021-12-29 ENCOUNTER — Encounter (HOSPITAL_COMMUNITY): Payer: Self-pay | Admitting: Psychiatry

## 2021-12-29 DIAGNOSIS — F4010 Social phobia, unspecified: Secondary | ICD-10-CM | POA: Diagnosis not present

## 2021-12-29 DIAGNOSIS — F418 Other specified anxiety disorders: Secondary | ICD-10-CM | POA: Diagnosis not present

## 2021-12-29 DIAGNOSIS — F4321 Adjustment disorder with depressed mood: Secondary | ICD-10-CM | POA: Diagnosis not present

## 2021-12-29 DIAGNOSIS — F341 Dysthymic disorder: Secondary | ICD-10-CM

## 2021-12-29 MED ORDER — SERTRALINE HCL 50 MG PO TABS: ORAL_TABLET | ORAL | 0 refills | Status: DC

## 2021-12-29 MED ORDER — HYDROXYZINE HCL 10 MG PO TABS: 10.0000 mg | ORAL_TABLET | Freq: Every evening | ORAL | 0 refills | Status: DC | PRN

## 2021-12-29 NOTE — Progress Notes (Signed)
[place] Follow up visit   Patient Identification: Sarah Hart MRN:  [id number] Date of Evaluation:  12/29/2021 Referral Source: primary care Chief Complaint:   follow up depression, anxiety  Visit Diagnosis:    ICD-10-CM   1. 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 Dysthymia  F34.1     3. Grief  F43.21     4. Social anxiety disorder  F40.10        Virtual Visit via Video Note  I connected with Sarah Hart on 12/29/21 at  9:30 AM EST by a video enabled telemedicine application and verified that I am speaking with the correct person using two identifiers.  Location: Patient: home Provider: home office   I discussed the limitations of evaluation and management by telemedicine and the availability of in person appointments. The patient expressed understanding and agreed to proceed.     I discussed the assessment and treatment plan with the patient. The patient was provided an opportunity to ask questions and all were answered. The patient agreed with the plan and demonstrated an understanding of the instructions.   The patient was advised to call back or seek an in-person evaluation if the symptoms worsen or if the condition fails to improve as anticipated.  I provided 20 minutes of non-face-to-face time during this encounter including chart review, documentation.       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, but plan to change job  Last visit added zoloft for anxiety, it has helpled with anxiety, performance and social anxiety She is not taking buspar or inderal regularly and feels not need Wants vistaril for prn anxiety that has hellped before   She sees [place] at [facility] for therapy   Denies using drugs or alcohol  Aggravating factor:  performance anxiety can be Modifying factor:family , friends  Duration since young age  No prior psych admission or suicide attempt Severity : feels if attention needs to be helped with meds  Past Psychiatric History: anxiety  Previous Psychotropic Medications: Yes    Past Medical History:  Past Medical History:  Diagnosis Date   Anxiety    Hypothyroidism    History reviewed. No pertinent surgical history.  Family Psychiatric History: denies  Family History:  Family History  Problem Relation Age of Onset   Hypertension Mother    Breast cancer Mother        Mother was dx in 2009   Hypertension Father    Multiple myeloma Father 62   Asthma Brother    Pancreatic cancer Maternal Grandmother    Prostate cancer Maternal Grandfather     Social History:   Social History   Socioeconomic History   Marital status: Single    Spouse name: Not on file   Number of children: Not on file   Years of education: Not on file   Highest education level: Not on file  Occupational History   Not on file  Tobacco Use   Smoking status: Never    Passive exposure: Never   Smokeless tobacco: Never  Vaping Use   Vaping Use: Never used  Substance and Sexual Activity   Alcohol use: Not Currently    Comment: once or twice a month   Drug use: No   Sexual activity: Never    Partners: Male  Comment: menarche 76yo  Other Topics Concern   Not on file  Social History Narrative   Not on file   Social Determinants of Health   Financial Resource Strain: Not on file  Food Insecurity: Not on file  Transportation Needs: Not on file  Physical Activity: Not on file  Stress: Not on file  Social Connections: Not on file      Allergies:  No Known Allergies  Metabolic Disorder Labs: No results found for: "HGBA1C", "MPG" No results found for: "PROLACTIN" No results found for: "CHOL", "TRIG", "HDL", "CHOLHDL", "VLDL", "LDLCALC" Lab Results  Component Value Date   TSH 1.222 10/26/2021     Therapeutic Level Labs: No results found for: "LITHIUM" No results found for: "CBMZ" No results found for: "VALPROATE"  Current Medications: Current Outpatient Medications  Medication Sig Dispense Refill   hydrOXYzine (ATARAX) 10 MG tablet Take 1 tablet (10 mg total) by mouth at bedtime as needed. 30 tablet 0   fluticasone (FLONASE) 50 MCG/ACT nasal spray Place into both nostrils daily.     sertraline (ZOLOFT) 50 MG tablet TAKE 1 TABLET BY MOUTH DAILY 30 tablet 0   SYNTHROID 50 MCG tablet TAKE 1 TABLET(50 MCG) BY MOUTH DAILY 90 tablet 2   valACYclovir (VALTREX) 1000 MG tablet Take 1 tablet (1,000 mg total) by mouth 3 (three) times daily. 21 tablet 3   No current facility-administered medications for this visit.      Psychiatric Specialty Exam: Review of Systems  Cardiovascular:  Negative for chest pain.  Skin:  Negative for rash.  Neurological:  Negative for tremors.  Psychiatric/Behavioral:  Negative for agitation and self-injury. The patient is not nervous/anxious.     Last menstrual period 12/03/2021.There is no height or weight on file to calculate BMI.  General Appearance: Casual  Eye Contact:  Fair  Speech:  Normal Rate  Volume:  Normal  Mood: better  Affect:  Congruent  Thought Process:  Goal Directed  Orientation:  Full (Time, Place, and Person)  Thought Content:  Rumination  Suicidal Thoughts:  No  Homicidal Thoughts:  No  Memory:  Immediate;   Fair Recent;   Fair  Judgement:  Fair  Insight:  Fair  Psychomotor Activity:  Decreased  Concentration:  Concentration: Fair and Attention Span: Fair  Recall:  [employer] of Knowledge:Good  Language: Good  Akathisia:  No  Handed:    AIMS (if indicated):  not done  Assets:  Desire for Improvement Financial Resources/Insurance Housing Social Support  ADL's:  Intact  Cognition: WNL  Sleep:  Fair   Screenings: GAD-7    Personnel officer Visit from 12/19/2021 in [place]  Office Visit from 10/27/2021 in [place] Office Visit from 09/25/2021 in [place] Office Visit from 06/17/2020 in [place] from 02/18/2020 in [place]  Total GAD-7 Score _0 [employer]    [place] Office Visit from 12/19/2021 in [place] Office Visit from 10/27/2021 in [place] Office Visit from 09/25/2021 in [place] Office Visit from 06/19/2021 in [place] Office Visit from 06/17/2020 in [place]  PHQ-2 Total Score _1 PHQ-9 Total Score _2 6  Flowsheet Row ED from 12/12/2021 in [place] Video Visit from 11/10/2021 in [facility] ED from 10/27/2021 in [place] DEPT  C-SSRS RISK CATEGORY No Risk No Risk No Risk       Assessment and Plan: as follows  Prior documentation reviewed  Social anxiety disorder; better with zoloft, continue and can stop buspar, not taking  Performance anxiety:manageable better with zoloft, continue and add vistaril for prn anxiety   ADHD; manageable  Depression; better with zoloft, will continue  Fu 6 -8 weeks or earlier if needed  Merian Capron, MD 12/15/20239:39 AM

## 2022-01-16 ENCOUNTER — Other Ambulatory Visit: Payer: Self-pay | Admitting: Internal Medicine

## 2022-01-16 ENCOUNTER — Encounter: Payer: Self-pay | Admitting: Internal Medicine

## 2022-01-16 DIAGNOSIS — E039 Hypothyroidism, unspecified: Secondary | ICD-10-CM

## 2022-01-16 MED ORDER — SYNTHROID 50 MCG PO TABS: ORAL_TABLET | ORAL | 2 refills | Status: DC

## 2022-01-29 ENCOUNTER — Other Ambulatory Visit (HOSPITAL_COMMUNITY): Payer: Self-pay

## 2022-01-29 MED ORDER — SERTRALINE HCL 50 MG PO TABS: ORAL_TABLET | ORAL | 0 refills | Status: DC

## 2022-02-16 ENCOUNTER — Telehealth (INDEPENDENT_AMBULATORY_CARE_PROVIDER_SITE_OTHER): Payer: 59 | Admitting: Psychiatry

## 2022-02-16 ENCOUNTER — Encounter (HOSPITAL_COMMUNITY): Payer: Self-pay | Admitting: Psychiatry

## 2022-02-16 DIAGNOSIS — F418 Other specified anxiety disorders: Secondary | ICD-10-CM | POA: Diagnosis not present

## 2022-02-16 DIAGNOSIS — F4010 Social phobia, unspecified: Secondary | ICD-10-CM

## 2022-02-16 DIAGNOSIS — F341 Dysthymic disorder: Secondary | ICD-10-CM

## 2022-02-16 MED ORDER — SERTRALINE HCL 50 MG PO TABS: ORAL_TABLET | ORAL | 0 refills | Status: DC

## 2022-02-16 NOTE — Progress Notes (Signed)
[place] Follow up visit   Patient Identification: Sarah Hart MRN:  [id number] Date of Evaluation:  02/16/2022 Referral Source: primary care Chief Complaint:   follow up depression, anxiety  Visit Diagnosis:    ICD-10-CM   1. Dysthymia  F34.1     2. Social anxiety disorder  F40.10     3. 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  Location: Patient: work Provider: home office   I discussed the limitations of evaluation and management by telemedicine and the availability of in person appointments. The patient expressed understanding and agreed to proceed.      I discussed the assessment and treatment plan with the patient. The patient was provided an opportunity to ask questions and all were answered. The patient agreed with the plan and demonstrated an understanding of the instructions.   The patient was advised to call back or seek an in-person evaluation if the symptoms worsen or if the condition fails to improve as anticipated.  I provided 15 plus  minutes of non-face-to-face time during this encounter.       History of Present Illness: Patient is a 27 years old, single African-American female initially referred by primary care physician to establish care for possible anxiety and inattention She just graduated from [employer] anxiety better, tolerating meds, seldom takes inderal  Increased zoloft now 50mg  is helping with social anxiety as well  Attention fair   She sees [place] at [facility] for therapy   Denies using drugs or alcohol  Aggravating factor: while performance or social situations Modifying factor:family , friends  Duration since young age  No prior psych admission or suicide attempt Severity : feels if  attention needs to be helped with meds  Past Psychiatric History: anxiety  Previous Psychotropic Medications: Yes    Past Medical History:  Past Medical History:  Diagnosis Date   Anxiety    Hypothyroidism    History reviewed. No pertinent surgical history.  Family Psychiatric History: denies  Family History:  Family History  Problem Relation Age of Onset   Hypertension Mother    Breast cancer Mother        Mother was dx in 2009   Hypertension Father    Multiple myeloma Father 65   Asthma Brother    Pancreatic cancer Maternal Grandmother    Prostate cancer Maternal Grandfather     Social History:   Social History   Socioeconomic History   Marital status: Single    Spouse name: Not on file   Number of children: Not on file   Years of education: Not on file   Highest education level: Not on file  Occupational History   Not on file  Tobacco Use   Smoking status: Never    Passive exposure: Never   Smokeless tobacco: Never  Vaping Use   Vaping Use: Never used  Substance and Sexual Activity   Alcohol use: Not Currently    Comment: once or twice a month   Drug use: No   Sexual activity: Never    Partners: Male    Comment: menarche 1yo  Other Topics Concern   Not on file  Social History Narrative   Not on file   Social Determinants of Health  Financial Resource Strain: Not on file  Food Insecurity: Not on file  Transportation Needs: Not on file  Physical Activity: Not on file  Stress: Not on file  Social Connections: Not on file      Allergies:  No Known Allergies  Metabolic Disorder Labs: No results found for: "HGBA1C", "MPG" No results found for: "PROLACTIN" No results found for: "CHOL", "TRIG", "HDL", "CHOLHDL", "VLDL", "LDLCALC" Lab Results  Component Value Date   TSH 1.222 10/26/2021    Therapeutic Level Labs: No results found for: "LITHIUM" No results found for: "CBMZ" No results found for: "VALPROATE"  Current  Medications: Current Outpatient Medications  Medication Sig Dispense Refill   fluticasone (FLONASE) 50 MCG/ACT nasal spray Place into both nostrils daily.     hydrOXYzine (ATARAX) 10 MG tablet Take 1 tablet (10 mg total) by mouth at bedtime as needed. 30 tablet 0   sertraline (ZOLOFT) 50 MG tablet TAKE 1 TABLET BY MOUTH DAILY 90 tablet 0   SYNTHROID 50 MCG tablet TAKE 1 TABLET(50 MCG) BY MOUTH DAILY 30 tablet 2   valACYclovir (VALTREX) 1000 MG tablet Take 1 tablet (1,000 mg total) by mouth 3 (three) times daily. 21 tablet 3   No current facility-administered medications for this visit.      Psychiatric Specialty Exam: Review of Systems  Cardiovascular:  Negative for chest pain.  Skin:  Negative for rash.  Neurological:  Negative for tremors.  Psychiatric/Behavioral:  Negative for agitation and self-injury. The patient is not nervous/anxious.     There were no vitals taken for this visit.There is no height or weight on file to calculate BMI.  General Appearance: Casual  Eye Contact:  Fair  Speech:  Normal Rate  Volume:  Normal  Mood: better  Affect:  Congruent  Thought Process:  Goal Directed  Orientation:  Full (Time, Place, and Person)  Thought Content:  Rumination  Suicidal Thoughts:  No  Homicidal Thoughts:  No  Memory:  Immediate;   Fair Recent;   Fair  Judgement:  Fair  Insight:  Fair  Psychomotor Activity:  Decreased  Concentration:  Concentration: Fair and Attention Span: Fair  Recall:  [employer] of Knowledge:Good  Language: Good  Akathisia:  No  Handed:    AIMS (if indicated):  not done  Assets:  Desire for Improvement Financial Resources/Insurance Housing Social Support  ADL's:  Intact  Cognition: WNL  Sleep:  Fair   Screenings: GAD-7    Personnel officer Visit from 12/19/2021 in [place] Office Visit from 10/27/2021 in [place] Office Visit from 09/25/2021 in [place] Office Visit from 06/17/2020 in [place] from 02/18/2020 in [place]  Total GAD-7 Score 11 11 15 1 4       PHQ2-9    [place] Office Visit from 12/19/2021 in [place] Office Visit from 10/27/2021 in [place] Office Visit from 09/25/2021 in [place] Office Visit from 06/19/2021 in [place] Office Visit from 06/17/2020 in [place]  PHQ-2 Total Score 2 2 3 1 3   PHQ-9 Total Score 6 5 6 3 6       Flowsheet Row ED from 12/12/2021 in [facility]  Video Visit from 11/10/2021 in [place] [facility] ED from 10/27/2021 in [facility] Emergency Department at [place] No Risk No Risk No Risk       Assessment and Plan: as follows  Prior documentation reviewed  Social anxiety disorder;improved continue zoloft  Performance anxiety:manageable better with zoloft, continue and add vistaril for prn anxiety   ADHD; manageable continue to work on organizing time Depression;better continue zoloft  90 days med sent Fu 70m.   Merian Capron, MD 2/2/202412:05 PM

## 2022-02-27 ENCOUNTER — Ambulatory Visit: Payer: 59 | Attending: Internal Medicine | Admitting: Internal Medicine

## 2022-02-27 ENCOUNTER — Encounter: Payer: Self-pay | Admitting: Internal Medicine

## 2022-02-27 VITALS — BP 144/83 | HR 73 | Temp 98.7°F | Ht 65.0 in | Wt 237.0 lb

## 2022-02-27 DIAGNOSIS — E669 Obesity, unspecified: Secondary | ICD-10-CM | POA: Diagnosis not present

## 2022-02-27 DIAGNOSIS — R030 Elevated blood-pressure reading, without diagnosis of hypertension: Secondary | ICD-10-CM

## 2022-02-27 DIAGNOSIS — D75839 Thrombocytosis, unspecified: Secondary | ICD-10-CM | POA: Diagnosis not present

## 2022-02-27 DIAGNOSIS — N926 Irregular menstruation, unspecified: Secondary | ICD-10-CM

## 2022-02-27 NOTE — Patient Instructions (Signed)

## 2022-02-27 NOTE — Progress Notes (Unsigned)
Patient ID: Sarah Hart, female    DOB: 07-14-1995  MRN: [id number]  CC: Follow-up (F/u on BP. /No to flu vax. )   Subjective: Sarah Hart is a 27 y.o. female who presents for chronic ds management Her concerns today include:  Hx of hypothyroid, fhx of breast CA in mother, obesity, anxiety    BP elev again today Drank a lot this past wkend for her BD.  Coming down off it.  Not a good experience plans to go back to no drinking at all Feels her anxiety spiked after then as well She has not been checking BP much since last visit  Some increase appetite with Zoloft Eating habits have not been as good over past mth, likely increase salt Had done good before BD - walking and eating a lot more fruits and veggies.    No menses for past 2 mths. Not sexually active Saw GYN, [place] - no multiple cyst on ovaries.  When exercising and eating healthy, menses more regular   Patient Active Problem List   Diagnosis Date Noted   Family history of breast cancer in mother 12/20/2017   Irregular menses 07/23/2017   Class 3 severe obesity without serious comorbidity with body mass index (BMI) of 40.0 to 44.9 in adult [facility]) 07/23/2017   Acquired hypothyroidism 02/08/2017   Allergic rhinitis 02/08/2017     Current Outpatient Medications on File Prior to Visit  Medication Sig Dispense Refill   fluticasone (FLONASE) 50 MCG/ACT nasal spray Place into both nostrils daily.     hydrOXYzine (ATARAX) 10 MG tablet Take 1 tablet (10 mg total) by mouth at bedtime as needed. 30 tablet 0   sertraline (ZOLOFT) 50 MG tablet TAKE 1 TABLET BY MOUTH DAILY 90 tablet 0   SYNTHROID 50 MCG tablet TAKE 1 TABLET(50 MCG) BY MOUTH DAILY 30 tablet 2   valACYclovir (VALTREX) 1000 MG tablet Take 1 tablet (1,000 mg total) by mouth 3 (three) times daily. 21 tablet 3   No current facility-administered medications on file prior to visit.    No Known Allergies  Social History   Socioeconomic History   Marital  status: Single    Spouse name: Not on file   Number of children: Not on file   Years of education: Not on file   Highest education level: Not on file  Occupational History   Not on file  Tobacco Use   Smoking status: Never    Passive exposure: Never   Smokeless tobacco: Never  Vaping Use   Vaping Use: Never used  Substance and Sexual Activity   Alcohol use: Not Currently    Comment: once or twice a month   Drug use: No   Sexual activity: Never    Partners: Male    Comment: menarche 12yo  Other Topics Concern   Not on file  Social History Narrative   Not on file   Social Determinants of Health   Financial Resource Strain: Not on file  Food Insecurity: Not on file  Transportation Needs: Not on file  Physical Activity: Not on file  Stress: Not on file  Social Connections: Not on file  Intimate Partner Violence: Not on file    Family History  Problem Relation Age of Onset   Hypertension Mother    Breast cancer Mother        Mother was dx in 2009   Hypertension Father    Multiple myeloma Father 67   Asthma Brother    Pancreatic  cancer Maternal Grandmother    Prostate cancer Maternal Grandfather     No past surgical history on file.  ROS: Review of Systems Negative except as stated above  PHYSICAL EXAM: BP (!) 144/83 (BP Location: Left Arm, Patient Position: Sitting, Cuff Size: Normal)   Pulse 73   Temp 98.7 F (37.1 C) (Oral)   Ht 5' 5"$  (1.651 m)   Wt 237 lb (107.5 kg)   SpO2 100%   BMI 39.44 kg/m   Wt Readings from Last 3 Encounters:  02/27/22 237 lb (107.5 kg)  12/19/21 224 lb (101.6 kg)  12/12/21 225 lb (102.1 kg)   BP 145/85 Physical Exam  {female adult master:310786} {female adult master:310785}     Latest Ref Rng & Units 10/28/2021    4:29 AM 10/26/2021    7:06 PM 09/29/2021   11:57 AM  CMP  Glucose 70 - 99 mg/dL 137  101    BUN 6 - 20 mg/dL 10  9    Creatinine 0.44 - 1.00 mg/dL 0.88  0.82    Sodium 135 - 145 mmol/L 136  139     Potassium 3.5 - 5.1 mmol/L 3.1  3.7  4.4   Chloride 98 - 111 mmol/L 106  107    CO2 22 - 32 mmol/L 25  25    Calcium 8.9 - 10.3 mg/dL 9.0  9.5    Total Protein 6.5 - 8.1 g/dL 7.9  8.5    Total Bilirubin 0.3 - 1.2 mg/dL 0.8  0.9    Alkaline Phos 38 - 126 U/L 91  99    AST 15 - 41 U/L 15  18    ALT 0 - 44 U/L 22  29     Lipid Panel  No results found for: "CHOL", "TRIG", "HDL", "CHOLHDL", "VLDL", "LDLCALC", "LDLDIRECT"  CBC    Component Value Date/Time   WBC 7.0 12/19/2021 0951   WBC 10.1 10/28/2021 0429   RBC 4.65 12/19/2021 0951   RBC 4.61 10/28/2021 0429   HGB 12.8 12/19/2021 0951   HCT 39.8 12/19/2021 0951   PLT 468 (H) 12/19/2021 0951   MCV 86 12/19/2021 0951   MCH 27.5 12/19/2021 0951   MCH 27.3 10/28/2021 0429   MCHC 32.2 12/19/2021 0951   MCHC 32.0 10/28/2021 0429   RDW 13.1 12/19/2021 0951   LYMPHSABS 2.3 12/19/2021 0951   MONOABS 0.7 10/28/2021 0429   EOSABS 0.2 12/19/2021 0951   BASOSABS 0.0 12/19/2021 0951    ASSESSMENT AND PLAN:  There are no diagnoses linked to this encounter.   Patient was given the opportunity to ask questions.  Patient verbalized understanding of the plan and was able to repeat key elements of the plan.   This documentation was completed using Radio producer.  Any transcriptional errors are unintentional.  No orders of the defined types were placed in this encounter.    Requested Prescriptions    No prescriptions requested or ordered in this encounter    No follow-ups on file.  Karle Plumber, MD, FACP

## 2022-02-28 LAB — CBC
Hematocrit: 39.3 % (ref 34.0–46.6)
Hemoglobin: 12.5 g/dL (ref 11.1–15.9)
MCH: 27.4 pg (ref 26.6–33.0)
MCHC: 31.8 g/dL (ref 31.5–35.7)
MCV: 86 fL (ref 79–97)
Platelets: 452 10*3/uL — ABNORMAL HIGH (ref 150–450)
RBC: 4.57 x10E6/uL (ref 3.77–5.28)
RDW: 13.2 % (ref 11.7–15.4)
WBC: 10.8 10*3/uL (ref 3.4–10.8)

## 2022-02-28 LAB — HEMOGLOBIN A1C
Est. average glucose Bld gHb Est-mCnc: 114 mg/dL
Hgb A1c MFr Bld: 5.6 % (ref 4.8–5.6)

## 2022-02-28 LAB — PROLACTIN: Prolactin: 17.4 ng/mL (ref 4.8–33.4)

## 2022-02-28 LAB — FSH/LH
FSH: 9.4 m[IU]/mL
LH: 20.9 m[IU]/mL

## 2022-03-20 ENCOUNTER — Encounter: Payer: Self-pay | Admitting: Internal Medicine

## 2022-03-26 ENCOUNTER — Ambulatory Visit: Payer: 59 | Admitting: Internal Medicine

## 2022-03-26 ENCOUNTER — Encounter (HOSPITAL_BASED_OUTPATIENT_CLINIC_OR_DEPARTMENT_OTHER): Payer: 59 | Admitting: Internal Medicine

## 2022-03-26 DIAGNOSIS — I10 Essential (primary) hypertension: Secondary | ICD-10-CM

## 2022-03-26 MED ORDER — SPIRONOLACTONE 25 MG PO TABS: 12.5000 mg | ORAL_TABLET | Freq: Every day | ORAL | 1 refills | Status: DC

## 2022-03-26 NOTE — Telephone Encounter (Signed)

## 2022-06-08 ENCOUNTER — Telehealth (HOSPITAL_COMMUNITY): Payer: 59 | Admitting: Psychiatry

## 2022-06-08 ENCOUNTER — Encounter (HOSPITAL_COMMUNITY): Payer: Self-pay

## 2022-06-26 ENCOUNTER — Ambulatory Visit: Payer: 59 | Attending: Internal Medicine | Admitting: Internal Medicine

## 2022-06-26 DIAGNOSIS — Z6841 Body Mass Index (BMI) 40.0 and over, adult: Secondary | ICD-10-CM

## 2022-06-26 DIAGNOSIS — R030 Elevated blood-pressure reading, without diagnosis of hypertension: Secondary | ICD-10-CM

## 2022-06-26 DIAGNOSIS — B009 Herpesviral infection, unspecified: Secondary | ICD-10-CM | POA: Diagnosis not present

## 2022-06-26 DIAGNOSIS — Z23 Encounter for immunization: Secondary | ICD-10-CM

## 2022-06-26 DIAGNOSIS — I10 Essential (primary) hypertension: Secondary | ICD-10-CM

## 2022-06-26 DIAGNOSIS — E039 Hypothyroidism, unspecified: Secondary | ICD-10-CM | POA: Diagnosis not present

## 2022-06-26 MED ORDER — VALACYCLOVIR HCL 1 G PO TABS
1000.0000 mg | ORAL_TABLET | Freq: Three times a day (TID) | ORAL | 3 refills | Status: DC
Start: 2022-06-26 — End: 2023-03-01

## 2022-06-26 MED ORDER — SYNTHROID 50 MCG PO TABS: ORAL_TABLET | ORAL | 2 refills | Status: DC

## 2022-06-26 MED ORDER — FLUTICASONE PROPIONATE 50 MCG/ACT NA SUSP: 1.0000 | Freq: Every day | NASAL | 1 refills | Status: DC | PRN

## 2022-06-26 NOTE — Progress Notes (Signed)
Patient ID: Sarah Hart, female    DOB: 05-13-1995  MRN: [id number]  CC: Follow-up (Follow up. Med refills. /No questions / concerns/Yes to Tdap vax)   Subjective: Sarah Hart is a 27 y.o. female who presents for chronic ds management Her concerns today include:  Hx of hypothyroid, fhx of breast CA in mother, obesity, anxiety    Elev BP:  continues to check BP.  Range has been 120s/mid 80s.  Very big on watching salt intake.  Obesity:  Up 25 lbs since last visit 4 mths ago.   "I have not been eating the best.  I have a sweet tooth."  She is in a new relationship and has been eating out more and cooking less at home.  Also at the job, there is always a bowl of candies or Reeses Cups and she loves eating several of them.  Also drinks ginger ale soda 2 to 3 cans a day.  She tries to make her meals healthy but she feels it is the snacking between meals that has contributed a lot to the weight gain.  Hypothyroid: Reports compliance with taking the levothyroxine.  Last TSH was checked in October of last year.  Requests refill on Valtrex. Patient Active Problem List   Diagnosis Date Noted   Family history of breast cancer in mother 12/20/2017   Irregular menses 07/23/2017   Class 3 severe obesity without serious comorbidity with body mass index (BMI) of 40.0 to 44.9 in adult [facility]) 07/23/2017   Acquired hypothyroidism 02/08/2017   Allergic rhinitis 02/08/2017     Current Outpatient Medications on File Prior to Visit  Medication Sig Dispense Refill   fluticasone (FLONASE) 50 MCG/ACT nasal spray Place into both nostrils daily.     hydrOXYzine (ATARAX) 10 MG tablet Take 1 tablet (10 mg total) by mouth at bedtime as needed. 30 tablet 0   sertraline (ZOLOFT) 50 MG tablet TAKE 1 TABLET BY MOUTH DAILY 90 tablet 0   SYNTHROID 50 MCG tablet TAKE 1 TABLET(50 MCG) BY MOUTH DAILY 30 tablet 2   valACYclovir (VALTREX) 1000 MG tablet Take 1 tablet (1,000 mg total) by mouth 3 (three) times  daily. 21 tablet 3   No current facility-administered medications on file prior to visit.    No Known Allergies  Social History   Socioeconomic History   Marital status: Single    Spouse name: Not on file   Number of children: Not on file   Years of education: Not on file   Highest education level: Master's degree (e.g., MA, MS, MEng, MEd, MSW, MBA)  Occupational History   Not on file  Tobacco Use   Smoking status: Never    Passive exposure: Never   Smokeless tobacco: Never  Vaping Use   Vaping Use: Never used  Substance and Sexual Activity   Alcohol use: Not Currently    Comment: once or twice a month   Drug use: No   Sexual activity: Never    Partners: Male    Comment: menarche 12yo  Other Topics Concern   Not on file  Social History Narrative   Not on file   Social Determinants of Health   Financial Resource Strain: Low Risk  (06/26/2022)   Overall Financial Resource Strain (CARDIA)    Difficulty of Paying Living Expenses: Not very hard  Food Insecurity: Food Insecurity Present (06/26/2022)   Hunger Vital Sign    Worried About Running Out of Food in the Last Year: Sometimes true  Ran Out of Food in the Last Year: Never true  Transportation Needs: No Transportation Needs (06/26/2022)   PRAPARE - Administrator, Civil Service (Medical): No    Lack of Transportation (Non-Medical): No  Physical Activity: Insufficiently Active (06/26/2022)   Exercise Vital Sign    Days of Exercise per Week: 2 days    Minutes of Exercise per Session: 40 min  Stress: No Stress Concern Present (06/26/2022)   [employer] of Occupational Health - Occupational Stress Questionnaire    Feeling of Stress : Only a little  Social Connections: Moderately Isolated (06/26/2022)   Social Connection and Isolation Panel [NHANES]    Frequency of Communication with Friends and Family: More than three times a week    Frequency of Social Gatherings with Friends and Family: More than  three times a week    Attends Religious Services: 1 to 4 times per year    Active Member of [employer] or Organizations: No    Attends Engineer, structural: Not on file    Marital Status: Never married  Intimate Partner Violence: Not on file    Family History  Problem Relation Age of Onset   Hypertension Mother    Breast cancer Mother        Mother was dx in 2009   Hypertension Father    Multiple myeloma Father 13   Asthma Brother    Pancreatic cancer Maternal Grandmother    Prostate cancer Maternal Grandfather     No past surgical history on file.  ROS: Review of Systems Negative except as stated above  PHYSICAL EXAM: BP 123/84 (BP Location: Left Arm, Patient Position: Sitting, Cuff Size: Large)   Pulse (!) 105   Temp 98.4 F (36.9 C) (Oral)   Ht 5\' 5"  (1.651 m)   Wt 262 lb (118.8 kg)   SpO2 99%   BMI 43.60 kg/m   Wt Readings from Last 3 Encounters:  06/26/22 262 lb (118.8 kg)  02/27/22 237 lb (107.5 kg)  12/19/21 224 lb (101.6 kg)    Physical Exam  General appearance - alert, well appearing, obese AAF and in no distress Mental status - normal mood, behavior, speech, dress, motor activity, and thought processes Chest - clear to auscultation, no wheezes, rales or rhonchi, symmetric air entry Heart - normal rate, regular rhythm, normal S1, S2, no murmurs, rubs, clicks or gallops Extremities - peripheral pulses normal, no pedal edema, no clubbing or cyanosis      Latest Ref Rng & Units 10/28/2021    4:29 AM 10/26/2021    7:06 PM 09/29/2021   11:57 AM  CMP  Glucose 70 - 99 mg/dL 578  469    BUN 6 - 20 mg/dL 10  9    Creatinine 6.29 - 1.00 mg/dL 5.28  4.13    Sodium 244 - 145 mmol/L 136  139    Potassium 3.5 - 5.1 mmol/L 3.1  3.7  4.4   Chloride 98 - 111 mmol/L 106  107    CO2 22 - 32 mmol/L 25  25    Calcium 8.9 - 10.3 mg/dL 9.0  9.5    Total Protein 6.5 - 8.1 g/dL 7.9  8.5    Total Bilirubin 0.3 - 1.2 mg/dL 0.8  0.9    Alkaline Phos 38 - 126 U/L 91   99    AST 15 - 41 U/L 15  18    ALT 0 - 44 U/L 22  29  Lipid Panel  No results found for: "CHOL", "TRIG", "HDL", "CHOLHDL", "VLDL", "LDLCALC", "LDLDIRECT"  CBC    Component Value Date/Time   WBC 10.8 02/27/2022 1618   WBC 10.1 10/28/2021 0429   RBC 4.57 02/27/2022 1618   RBC 4.61 10/28/2021 0429   HGB 12.5 02/27/2022 1618   HCT 39.3 02/27/2022 1618   PLT 452 (H) 02/27/2022 1618   MCV 86 02/27/2022 1618   MCH 27.4 02/27/2022 1618   MCH 27.3 10/28/2021 0429   MCHC 31.8 02/27/2022 1618   MCHC 32.0 10/28/2021 0429   RDW 13.2 02/27/2022 1618   LYMPHSABS 2.3 12/19/2021 0951   MONOABS 0.7 10/28/2021 0429   EOSABS 0.2 12/19/2021 0951   BASOSABS 0.0 12/19/2021 0951    ASSESSMENT AND PLAN: 1. Morbid obesity with body mass index (BMI) of 40.0 or higher (HCC) Encourage patient not to drink her calories.  Try to drink more water. Encouraged her to take healthy snacks with her to work like fruits or nuts. We discussed trying her with medication to assist with weight loss like Wegovy.  However patient states she may consider that in the future she prefers to try to do things naturally.  She will work on her eating habits.  Her boyfriend is into fitness and works out regularly.  They plan to start going to the gym together to workout. - Amb ref to Medical Nutrition Therapy-MNT  2. Elevated blood pressure reading in office with diagnosis of hypertension Diastolic blood pressure today is above goal and has been above goal at home recently.  Previous numbers that she sent me about 3 months ago were good.  She will continue to try to limit salt in the foods.  She will continue to monitor blood pressure and send me some readings in the next 2 to 3 weeks. - Basic Metabolic Panel  3. Acquired hypothyroidism - SYNTHROID 50 MCG tablet; TAKE 1 TABLET(50 MCG) BY MOUTH DAILY  Dispense: 30 tablet; Refill: 2 - TSH  4. Recurrent HSV (herpes simplex virus) - valACYclovir (VALTREX) 1000 MG tablet;  Take 1 tablet (1,000 mg total) by mouth 3 (three) times daily.  Dispense: 21 tablet; Refill: 3  5. Need for Tdapt given  Patient was given the opportunity to ask questions.  Patient verbalized understanding of the plan and was able to repeat key elements of the plan.   This documentation was completed using Paediatric nurse.  Any transcriptional errors are unintentional.  No orders of the defined types were placed in this encounter.    Requested Prescriptions   Pending Prescriptions Disp Refills   fluticasone (FLONASE) 50 MCG/ACT nasal spray      Sig: Place into both nostrils daily.   SYNTHROID 50 MCG tablet 30 tablet 2    Sig: TAKE 1 TABLET(50 MCG) BY MOUTH DAILY   valACYclovir (VALTREX) 1000 MG tablet 21 tablet 3    Sig: Take 1 tablet (1,000 mg total) by mouth 3 (three) times daily.    No follow-ups on file.  Jonah Blue, MD, FACP

## 2022-06-27 ENCOUNTER — Other Ambulatory Visit: Payer: Self-pay | Admitting: Internal Medicine

## 2022-06-27 DIAGNOSIS — E039 Hypothyroidism, unspecified: Secondary | ICD-10-CM

## 2022-06-27 LAB — TSH: TSH: 3.47 u[IU]/mL (ref 0.450–4.500)

## 2022-06-29 LAB — BASIC METABOLIC PANEL
BUN/Creatinine Ratio: 17 (ref 9–23)
BUN: 14 mg/dL (ref 6–20)
CO2: 20 mmol/L (ref 20–29)
Calcium: 9 mg/dL (ref 8.7–10.2)
Chloride: 104 mmol/L (ref 96–106)
Creatinine, Ser: 0.82 mg/dL (ref 0.57–1.00)
Glucose: 91 mg/dL (ref 70–99)
Potassium: 3.9 mmol/L (ref 3.5–5.2)
Sodium: 138 mmol/L (ref 134–144)
eGFR: 100 mL/min/{1.73_m2} (ref >59)

## 2022-06-29 LAB — SPECIMEN STATUS REPORT

## 2022-07-02 ENCOUNTER — Telehealth (HOSPITAL_COMMUNITY): Payer: Self-pay | Admitting: *Deleted

## 2022-07-02 MED ORDER — SERTRALINE HCL 50 MG PO TABS: ORAL_TABLET | ORAL | 0 refills | Status: DC

## 2022-07-02 NOTE — Addendum Note (Signed)
Addended by: Thresa Ross on: 07/02/2022 02:27 PM   Modules accepted: Orders

## 2022-07-02 NOTE — Telephone Encounter (Signed)
[id number] - [place], [place] - [address] AT [facility] [address]   sertraline (ZOLOFT) 50 MG tablet [[id number]]   Order Details Dose, Route, Frequency: As Directed  Dispense Quantity: 90 tablet Refills: 0   Note to Pharmacy: Discontinue inderal and also discontinue orders of buspar,       Sig: TAKE 1 TABLET BY MOUTH DAILY    Last Appt::02/16/22 Next Appt::10/26/22

## 2022-07-05 ENCOUNTER — Encounter: Payer: Self-pay | Admitting: Dietician

## 2022-07-05 ENCOUNTER — Encounter: Payer: 59 | Attending: Internal Medicine | Admitting: Dietician

## 2022-07-05 NOTE — Progress Notes (Addendum)
Medical Nutrition Therapy  Appointment Start time:  1110  Appointment End time:  1200  Primary concerns today: pt is concerned about her snacking at work.    Referral diagnosis: E66.01 Preferred learning style: no preference indicated Learning readiness: ready   NUTRITION ASSESSMENT   Anthropometrics  Ht: 64 in Wt: 265 lb  Clinical Medical Hx: anxiety, thyroid disease Medications: reviewed, synthroid Labs: reviewed, A1c 5.6% 02/2022 Notable Signs/Symptoms: none reported Food Allergies: none   Lifestyle & Dietary Hx  Pt states she is lactose intolerant but only some foods cause symptoms.   Pt states she tries to eat healthy at meals and include a carbohydrate, protein, and vegetable, but feels like her snacks are unhealthy and states she has an issue with snacking.   Pt reports she limits red meat, and may have a steak once a month, otherwise has chicken, [place], fish.   Pt states her boss buys snacks for the break room, which usually includes candy, chips, etc. Pt states she enjoys pastries and feels like she has a sweet tooth.   Pt states she has been drinking a lot of ginger ale recently, and states she likes the fizziness. She states she was drinking 2-3 a day but stopped about a week ago.   Pt reports when she is depressed or anxious she eats less. She states a few months ago she had a depressive episode and lost a lot of weight because it was hard for her to eat.   Pt states she prefers walking outside than going to the gym.   Estimated daily fluid intake: 48 oz Supplements: MVI occasionally Sleep: 6-8 hours Stress / self-care: moderate stress, pt states she uses her support system with more stressed, sees a therapist, enjoys walking Current average weekly physical activity: walks boyfriends dog (short)  24-Hr Dietary Recall First Meal: scrambled eggs and avocado OR oatmeal OR multigrain cheerios Snack: some candy Second Meal: chipotle Snack: candy or  chips/pretzels Third Meal: chicken/turkey or fish with starch (sweet potato or rice) and vegetable Snack: ice cream  Beverages: water, ginger ale 2-3 a day,    NUTRITION DIAGNOSIS  NB-1.1 Food and nutrition-related knowledge deficit As related to lack of prior nutrition education by a nutrition professional.  As evidenced by pt report.   NUTRITION INTERVENTION  Nutrition education (E-1) on the following topics:  Fruits & Vegetables: Aim to fill half your plate with a variety of fruits and vegetables. They are rich in vitamins, minerals, and fiber, and can help reduce the risk of chronic diseases. Choose a colorful assortment of fruits and vegetables to ensure you get a wide range of nutrients. Grains and Starches: Make at least half of your grain choices whole grains, such as brown rice, whole wheat bread, and oats. Whole grains provide fiber, which aids in digestion and healthy cholesterol levels. Aim for whole forms of starchy vegetables such as potatoes, sweet potatoes, beans, peas, and corn, which are fiber rich and provide many vitamins and minerals.  Protein: Incorporate lean sources of protein, such as poultry, fish, beans, nuts, and seeds, into your meals. Protein is essential for building and repairing tissues, staying full, balancing blood sugar, as well as supporting immune function. Dairy: Include low-fat or fat-free dairy products like milk, yogurt, and cheese in your diet. Dairy foods are excellent sources of calcium and vitamin D, which are crucial for bone health.  Physical Activity: Aim for 60 minutes of physical activity daily. Regular physical activity promotes overall health-including helping to  reduce risk for heart disease and diabetes, promoting mental health, and helping [place] sleep better.   Handouts Provided Include  Meal Ideas Plate Method Snack Ideas  Learning Style & Readiness for Change Teaching method utilized: Visual & Auditory  Demonstrated degree of  understanding via: Teach Back  Barriers to learning/adherence to lifestyle change: none  Goals Established by Pt  Goal: Go walking 2 days per for 30 minutes.   Goal: At snack between lunch and dinner include a protein and carb.   Aim to eat within 1-2 hours of waking up and every 3-5 hours following.   At meals, aim to include 1/2 plate non-starchy vegetables, 1/4 plate protein, and 1/4 plate complex carbs.   Sparkling water brand: Spindrift  Salt free seasoning brand: [employer]    MONITORING & EVALUATION Dietary intake, weekly physical activity, and follow up in 3 months.  Next Steps  Patient is to call for questions.

## 2022-07-05 NOTE — Patient Instructions (Signed)
Goal: Go walking 2 days per for 30 minutes.   Goal: At snack between lunch and dinner include a protein and carb.   Aim to eat within 1-2 hours of waking up and every 3-5 hours following.   At meals, aim to include 1/2 plate non-starchy vegetables, 1/4 plate protein, and 1/4 plate complex carbs.   Sparkling water brand: Spindrift  Salt free seasoning brand: Dewayne Shorter

## 2022-07-26 ENCOUNTER — Encounter: Payer: Self-pay | Admitting: Internal Medicine

## 2022-08-03 ENCOUNTER — Telehealth (HOSPITAL_COMMUNITY): Payer: 59 | Admitting: Psychiatry

## 2022-08-08 ENCOUNTER — Encounter (HOSPITAL_COMMUNITY): Payer: Self-pay | Admitting: Psychiatry

## 2022-08-08 ENCOUNTER — Encounter (HOSPITAL_COMMUNITY): Payer: Self-pay

## 2022-08-08 ENCOUNTER — Telehealth (HOSPITAL_COMMUNITY): Payer: Self-pay | Admitting: Psychiatry

## 2022-08-08 ENCOUNTER — Telehealth (HOSPITAL_COMMUNITY): Payer: 59 | Admitting: Psychiatry

## 2022-08-08 NOTE — Telephone Encounter (Signed)
Patient called at [phone] stating she had overslept and missed her appointment at 0800. Patient requested reschedule however the no-show today is the second consecutive and 3rd overall which violates the clinic no-show policy. Patient was made aware at time of scheduling that another no-show could result in dismissal from provider. Patient requesting to reschedule and stated she also needs refill of  sertraline (ZOLOFT) 50 MG tablet however confirmed still has an active order from 07/02/2022 on file at the pharmacy for 90 tablets. Will contact patient to inform her about the refill and provider's decision about dismissal once rendered.

## 2022-08-23 ENCOUNTER — Encounter: Payer: Self-pay | Admitting: Internal Medicine

## 2022-10-05 ENCOUNTER — Ambulatory Visit: Payer: 59 | Admitting: Dietician

## 2022-10-26 ENCOUNTER — Ambulatory Visit: Payer: 59 | Attending: Internal Medicine | Admitting: Internal Medicine

## 2022-10-26 ENCOUNTER — Encounter: Payer: Self-pay | Admitting: Internal Medicine

## 2022-10-26 VITALS — BP 141/82 | HR 105 | Ht 64.0 in | Wt 273.0 lb

## 2022-10-26 DIAGNOSIS — Z6841 Body Mass Index (BMI) 40.0 and over, adult: Secondary | ICD-10-CM

## 2022-10-26 DIAGNOSIS — E039 Hypothyroidism, unspecified: Secondary | ICD-10-CM

## 2022-10-26 DIAGNOSIS — R590 Localized enlarged lymph nodes: Secondary | ICD-10-CM

## 2022-10-26 DIAGNOSIS — E66813 Obesity, class 3: Secondary | ICD-10-CM

## 2022-10-26 DIAGNOSIS — F411 Generalized anxiety disorder: Secondary | ICD-10-CM

## 2022-10-26 DIAGNOSIS — R030 Elevated blood-pressure reading, without diagnosis of hypertension: Secondary | ICD-10-CM | POA: Diagnosis not present

## 2022-10-26 DIAGNOSIS — F330 Major depressive disorder, recurrent, mild: Secondary | ICD-10-CM

## 2022-10-26 MED ORDER — FLUTICASONE PROPIONATE 50 MCG/ACT NA SUSP: 1.0000 | Freq: Every day | NASAL | 1 refills | Status: DC | PRN

## 2022-10-26 NOTE — Progress Notes (Signed)
Patient ID: Sarah Hart, female    DOB: 1995-06-09  MRN: [id number]  CC: Medical Management of Chronic Issues (Bump on back of right ear)   Subjective: Sarah Hart is a 27 y.o. female who presents for chronic ds management. Her concerns today include:  Hx of hypothyroid, fhx of breast CA in mother, obesity, anxiety    Discussed the use of AI scribe software for clinical note transcription with the patient, who gave verbal consent to proceed.  History of Present Illness   The patient, with a history of intermittent elev BP, thyroid disorder, and weight issues, presents with concerns about weight gain and high blood pressure. She reports struggling with weight management, particularly due to stress eating and a preference for sweets. Despite efforts to maintain a balanced diet and regular exercise, the patient acknowledges difficulty in maintaining consistency due to life stressors and the cost of healthy food. She has sought advice from a nutritionist and has started to implement the recommended dietary changes.  Just recently started going to the gym again 3 times a week with her boyfriend.  She works out for 35 to 60 minutes on [employer].  The patient also reports a bump behind the RT ear, which she noticed yesterday. It is associated with some soreness but no other symptoms. She has a history of teeth clenching due to anxiety, which sometimes causes tension in the jaw.  The patient is on levothyroxine for a thyroid condition and takes it in the mornings .  The patient is also on sertraline and hydroxyzine for anxiety and depression, prescribed by a psychiatrist. However, she missed several appointments and was dismissed from the psychiatrist's care. She requests a referral to a new psychiatrist.  We have been monitoring her blood pressure.  The patient's blood pressure readings at home are usually within the normal range, but she often experiences elevated readings at the  doctor's office, which she attributes to anxiety.  Recent home blood pressure readings were 118/75, 120/78.     Patient Active Problem List   Diagnosis Date Noted   Family history of breast cancer in mother 12/20/2017   Irregular menses 07/23/2017   Class 3 severe obesity without serious comorbidity with body mass index (BMI) of 40.0 to 44.9 in adult [facility]) 07/23/2017   Acquired hypothyroidism 02/08/2017   Allergic rhinitis 02/08/2017     Current Outpatient Medications on File Prior to Visit  Medication Sig Dispense Refill   hydrOXYzine (ATARAX) 10 MG tablet Take 1 tablet (10 mg total) by mouth at bedtime as needed. 30 tablet 0   sertraline (ZOLOFT) 50 MG tablet TAKE 1 TABLET BY MOUTH DAILY 90 tablet 0   SYNTHROID 50 MCG tablet TAKE 1 TABLET(50 MCG) BY MOUTH DAILY 30 tablet 2   valACYclovir (VALTREX) 1000 MG tablet Take 1 tablet (1,000 mg total) by mouth 3 (three) times daily. 21 tablet 3   No current facility-administered medications on file prior to visit.    No Known Allergies  Social History   Socioeconomic History   Marital status: Single    Spouse name: Not on file   Number of children: Not on file   Years of education: Not on file   Highest education level: Master's degree (e.g., MA, MS, MEng, MEd, MSW, MBA)  Occupational History   Not on file  Tobacco Use   Smoking status: Never    Passive exposure: Never   Smokeless tobacco: Never  Vaping Use   Vaping status: Never Used  Substance and Sexual Activity   Alcohol use: Not Currently    Comment: once or twice a month   Drug use: No   Sexual activity: Never    Partners: Male    Comment: menarche 12yo  Other Topics Concern   Not on file  Social History Narrative   Not on file   Social Determinants of Health   Financial Resource Strain: Medium Risk (10/26/2022)   Overall Financial Resource Strain (CARDIA)    Difficulty of Paying Living Expenses: Somewhat hard  Food Insecurity: No Food Insecurity (10/26/2022)    Hunger Vital Sign    Worried About Running Out of Food in the Last Year: Never true    Ran Out of Food in the Last Year: Never true  Transportation Needs: No Transportation Needs (10/26/2022)   PRAPARE - Administrator, Civil Service (Medical): No    Lack of Transportation (Non-Medical): No  Physical Activity: Sufficiently Active (10/26/2022)   Exercise Vital Sign    Days of Exercise per Week: 3 days    Minutes of Exercise per Session: 50 min  Stress: No Stress Concern Present (10/26/2022)   [employer] of Occupational Health - Occupational Stress Questionnaire    Feeling of Stress : Not at all  Social Connections: Moderately Isolated (10/26/2022)   Social Connection and Isolation Panel [NHANES]    Frequency of Communication with Friends and Family: More than three times a week    Frequency of Social Gatherings with Friends and Family: More than three times a week    Attends Religious Services: More than 4 times per year    Active Member of [employer] or Organizations: No    Attends Banker Meetings: Never    Marital Status: Never married  Intimate Partner Violence: Not At Risk (10/26/2022)   Humiliation, Afraid, Rape, and Kick questionnaire    Fear of Current or Ex-Partner: No    Emotionally Abused: No    Physically Abused: No    Sexually Abused: No    Family History  Problem Relation Age of Onset   Hypertension Mother    Breast cancer Mother        Mother was dx in 2009   Hypertension Father    Multiple myeloma Father 67   Asthma Brother    Pancreatic cancer Maternal Grandmother    Prostate cancer Maternal Grandfather     No past surgical history on file.  ROS: Review of Systems Negative except as stated above  PHYSICAL EXAM: BP (!) 141/82   Pulse (!) 105   Ht 5\' 4"  (1.626 m)   Wt 273 lb (123.8 kg)   SpO2 100%   BMI 46.86 kg/m   Wt Readings from Last 3 Encounters:  10/26/22 273 lb (123.8 kg)  07/05/22 265 lb (120.2 kg)  06/26/22  262 lb (118.8 kg)    Physical Exam  General appearance - alert, well appearing, and in no distress Mental status - normal mood, behavior, speech, dress, motor activity, and thought processes Neck - supple, very tiny lymph node felt below the right ear posteriorly.  No significant mass felt in the area above the mastoid bone. Ears: Both ear canal and membranes are within normal limits. Chest - clear to auscultation, no wheezes, rales or rhonchi, symmetric air entry Heart - normal rate, regular rhythm, normal S1, S2, no murmurs, rubs, clicks or gallops Extremities - peripheral pulses normal, no pedal edema, no clubbing or cyanosis     10/26/2022  4:29 PM 07/05/2022   11:13 AM 06/26/2022    3:43 PM  Depression screen PHQ 2/9  Decreased Interest 1 0 0  Down, Depressed, Hopeless 1 0 0  PHQ - 2 Score 2 0 0  Altered sleeping 0  0  Tired, decreased energy 1  0  Change in appetite 2  0  Feeling bad or failure about yourself  0  0  Trouble concentrating 2  1  Moving slowly or fidgety/restless 0  0  Suicidal thoughts 0  0  PHQ-9 Score 7  1      10/26/2022    4:29 PM 06/26/2022    3:43 PM 02/27/2022    3:25 PM 12/19/2021    9:23 AM  GAD 7 : Generalized Anxiety Score  Nervous, Anxious, on Edge 2 1 1 2   Control/stop worrying 1 1 1 2   Worry too much - different things 2 1 1 2   Trouble relaxing 0 0 1 2  Restless 0 0 0 0  Easily annoyed or irritable 1 0 0 1  Afraid - awful might happen 1 0 1 2  Total GAD 7 Score 7 3 5 11         10/26/2022    4:29 PM 07/05/2022   11:13 AM 06/26/2022    3:43 PM  Depression screen PHQ 2/9  Decreased Interest 1 0 0  Down, Depressed, Hopeless 1 0 0  PHQ - 2 Score 2 0 0  Altered sleeping 0  0  Tired, decreased energy 1  0  Change in appetite 2  0  Feeling bad or failure about yourself  0  0  Trouble concentrating 2  1  Moving slowly or fidgety/restless 0  0  Suicidal thoughts 0  0  PHQ-9 Score 7  1       Latest Ref Rng & Units 06/26/2022     4:37 PM 10/28/2021    4:29 AM 10/26/2021    7:06 PM  CMP  Glucose 70 - 99 mg/dL 91  295  621   BUN 6 - 20 mg/dL 14  10  9    Creatinine 0.57 - 1.00 mg/dL 3.08  6.57  8.46   Sodium 134 - 144 mmol/L 138  136  139   Potassium 3.5 - 5.2 mmol/L 3.9  3.1  3.7   Chloride 96 - 106 mmol/L 104  106  107   CO2 20 - 29 mmol/L 20  25  25    Calcium 8.7 - 10.2 mg/dL 9.0  9.0  9.5   Total Protein 6.5 - 8.1 g/dL  7.9  8.5   Total Bilirubin 0.3 - 1.2 mg/dL  0.8  0.9   Alkaline Phos 38 - 126 U/L  91  99   AST 15 - 41 U/L  15  18   ALT 0 - 44 U/L  22  29    Lipid Panel  No results found for: "CHOL", "TRIG", "HDL", "CHOLHDL", "VLDL", "LDLCALC", "LDLDIRECT"  CBC    Component Value Date/Time   WBC 10.8 02/27/2022 1618   WBC 10.1 10/28/2021 0429   RBC 4.57 02/27/2022 1618   RBC 4.61 10/28/2021 0429   HGB 12.5 02/27/2022 1618   HCT 39.3 02/27/2022 1618   PLT 452 (H) 02/27/2022 1618   MCV 86 02/27/2022 1618   MCH 27.4 02/27/2022 1618   MCH 27.3 10/28/2021 0429   MCHC 31.8 02/27/2022 1618   MCHC 32.0 10/28/2021 0429   RDW 13.2 02/27/2022 1618   LYMPHSABS 2.3  12/19/2021 0951   MONOABS 0.7 10/28/2021 0429   EOSABS 0.2 12/19/2021 0951   BASOSABS 0.0 12/19/2021 0951    ASSESSMENT AND PLAN: 1. Acquired hypothyroidism Continue levothyroxine.  TSH done in June of this year was normal.  2. Postauricular lymphadenopathy Small, lymph node noted behind patient's ear. No other palpable lymph nodes in the neck. No signs of ear infection. -Observe for now and reassess if symptoms persist or worsen.   3. White coat syndrome without diagnosis of hypertension Elevated blood pressure readings in office, but patient reports normal readings at home. Possible white coat hypertension. -Advise patient to bring home blood pressure monitor to next visit for comparison with office readings. -Continue current management and monitor closely.  4. Class 3 severe obesity due to excess calories without serious  comorbidity with body mass index (BMI) of 45.0 to 49.9 in adult [facility]) Encourage patient to continue to work on eating habits and trying to eliminate the sweets. We discussed trying her with [facility].  Patient still wants to hold off for now stating that she prefers to do it the natural way.  Encouraged her to continue regular exercise and consider purchasing a home exercise device like a treadmill to use on days when she does not feel like making a trip to the gym.     5.  Anxiety and Depression Patient reports benefit from sertraline and hydroxyzine. Previous psychiatrist dismissed patient due to missed appointments. Patient is open to medication adjustments by primary care provider if needed. -Continue current management with sertraline and hydroxyzine. -Consider referral to a new psychiatrist if medication adjustments become necessary or if patient's mental health worsens.            Patient was given the opportunity to ask questions.  Patient verbalized understanding of the plan and was able to repeat key elements of the plan.   This documentation was completed using Paediatric nurse.  Any transcriptional errors are unintentional.  No orders of the defined types were placed in this encounter.    Requested Prescriptions   Signed Prescriptions Disp Refills   fluticasone (FLONASE) 50 MCG/ACT nasal spray 16 g 1    Sig: Place 1 spray into both nostrils daily as needed for allergies or rhinitis.    Return in about 4 months (around 02/26/2023).  Jonah Blue, MD, FACP

## 2022-11-08 ENCOUNTER — Telehealth (HOSPITAL_COMMUNITY): Payer: Self-pay | Admitting: *Deleted

## 2022-11-08 NOTE — Telephone Encounter (Signed)
LVM FOR PATIENT TO SCHEDULE APPT TO CONTINUE FUTURE REFILLS

## 2022-11-12 ENCOUNTER — Telehealth (HOSPITAL_COMMUNITY): Payer: Self-pay | Admitting: *Deleted

## 2022-11-12 NOTE — Telephone Encounter (Signed)
Rx REFILL REQUEST sertraline (ZOLOFT) 50 MG tablet  NEXT APPT 03-01-23 LAST APPT 02-16-22 LVM FOR PATIENT TO SCHEDULE APPT TO CONTINUE FUTURE REFILLS

## 2022-11-15 ENCOUNTER — Encounter: Payer: Self-pay | Admitting: Internal Medicine

## 2022-11-15 ENCOUNTER — Other Ambulatory Visit: Payer: Self-pay | Admitting: Internal Medicine

## 2022-11-15 MED ORDER — SERTRALINE HCL 50 MG PO TABS: ORAL_TABLET | ORAL | 1 refills | Status: DC

## 2022-11-15 MED ORDER — HYDROXYZINE HCL 10 MG PO TABS: 10.0000 mg | ORAL_TABLET | Freq: Every evening | ORAL | 3 refills | Status: AC | PRN

## 2022-11-22 ENCOUNTER — Encounter: Payer: Self-pay | Admitting: Internal Medicine

## 2022-12-20 ENCOUNTER — Encounter: Payer: Self-pay | Admitting: Internal Medicine

## 2022-12-20 ENCOUNTER — Other Ambulatory Visit: Payer: Self-pay

## 2022-12-20 ENCOUNTER — Ambulatory Visit: Payer: 59 | Attending: Internal Medicine | Admitting: Internal Medicine

## 2022-12-20 VITALS — BP 141/84 | HR 122 | Temp 98.0°F | Ht 64.0 in | Wt 268.0 lb

## 2022-12-20 DIAGNOSIS — R000 Tachycardia, unspecified: Secondary | ICD-10-CM | POA: Insufficient documentation

## 2022-12-20 DIAGNOSIS — Z1321 Encounter for screening for nutritional disorder: Secondary | ICD-10-CM | POA: Insufficient documentation

## 2022-12-20 DIAGNOSIS — Z803 Family history of malignant neoplasm of breast: Secondary | ICD-10-CM | POA: Insufficient documentation

## 2022-12-20 DIAGNOSIS — E039 Hypothyroidism, unspecified: Secondary | ICD-10-CM | POA: Diagnosis not present

## 2022-12-20 DIAGNOSIS — J069 Acute upper respiratory infection, unspecified: Secondary | ICD-10-CM | POA: Diagnosis not present

## 2022-12-20 DIAGNOSIS — M5431 Sciatica, right side: Secondary | ICD-10-CM | POA: Diagnosis not present

## 2022-12-20 DIAGNOSIS — J209 Acute bronchitis, unspecified: Secondary | ICD-10-CM | POA: Diagnosis present

## 2022-12-20 DIAGNOSIS — M79604 Pain in right leg: Secondary | ICD-10-CM | POA: Diagnosis present

## 2022-12-20 NOTE — Progress Notes (Signed)
Patient ID: Sarah Hart, female    DOB: [birth year]  MRN: [id number]  CC: Leg Pain (Intermittent R leg pain - poss sciatica/Requesting to check Vit D/No to flu vax.)   Subjective: Sarah Hart is a 27 y.o. female who presents for acute visit management. Her concerns today include:  Hx of hypothyroid, fhx of breast CA in mother, obesity, anxiety    Discussed the use of AI scribe software for clinical note transcription with the patient, who gave verbal consent to proceed.  History of Present Illness   The patient initially scheduled the appointment due to leg pain. The pain, which started in early November, was different from her usual sciatica symptoms. It was located in the hip, groin, and thigh, extending to the knee. The patient believes the pain was triggered by running on a treadmill for the first time, as part of her new gym routine. After taking ibuprofen and performing some stretches, the pain resolved and has not recurred.  The patient also reports frequent respiratory illnesses, including a recent episode of acute bronchitis diagnosed at an urgent care center. She is currently experiencing symptoms of congestion, runny nose, and clogged ears, which started a few days ago after being exposed to a sick family member over thanksgiving. She believes she had a fever the previous night. The patient has been taking Sudafed to manage the congestion. She expresses concern about her susceptibility to respiratory illnesses, noting that she seems to get sick easily and frequently, especially during the winter months.  She would like to have her vitamin D level checked.  Feels drain at times. She has not had flu vaccine or an updated COVID booster for this fall.  The patient's blood pressure is elevated during the visit, which she attributes to nervousness. She reports that her blood pressure readings at home are typically normal with most recent reading being 112/78.  She is also noted  to be tachycardic today.  She reports that she can feel her heart racing some.    Patient Active Problem List   Diagnosis Date Noted   Family history of breast cancer in mother 12/20/2017   Irregular menses 07/23/2017   Class 3 severe obesity without serious comorbidity with body mass index (BMI) of 40.0 to 44.9 in adult [facility]) 07/23/2017   Acquired hypothyroidism 02/08/2017   Allergic rhinitis 02/08/2017     Current Outpatient Medications on File Prior to Visit  Medication Sig Dispense Refill   fluticasone (FLONASE) 50 MCG/ACT nasal spray Place 1 spray into both nostrils daily as needed for allergies or rhinitis. 16 g 1   hydrOXYzine (ATARAX) 10 MG tablet Take 1 tablet (10 mg total) by mouth at bedtime as needed. 30 tablet 3   sertraline (ZOLOFT) 50 MG tablet TAKE 1 TABLET BY MOUTH DAILY 90 tablet 1   SYNTHROID 50 MCG tablet TAKE 1 TABLET(50 MCG) BY MOUTH DAILY 30 tablet 2   valACYclovir (VALTREX) 1000 MG tablet Take 1 tablet (1,000 mg total) by mouth 3 (three) times daily. 21 tablet 3   No current facility-administered medications on file prior to visit.    No Known Allergies  Social History   Socioeconomic History   Marital status: Single    Spouse name: Not on file   Number of children: Not on file   Years of education: Not on file   Highest education level: Master's degree (e.g., MA, MS, MEng, MEd, MSW, MBA)  Occupational History   Not on file  Tobacco Use  Smoking status: Never    Passive exposure: Never   Smokeless tobacco: Never  Vaping Use   Vaping status: Never Used  Substance and Sexual Activity   Alcohol use: Not Currently    Comment: once or twice a month   Drug use: No   Sexual activity: Never    Partners: Male    Comment: menarche 12yo  Other Topics Concern   Not on file  Social History Narrative   Not on file   Social Determinants of Health   Financial Resource Strain: Medium Risk (12/19/2022)   Overall Financial Resource Strain (CARDIA)     Difficulty of Paying Living Expenses: Somewhat hard  Food Insecurity: Food Insecurity Present (12/19/2022)   Hunger Vital Sign    Worried About Running Out of Food in the Last Year: Sometimes true    Ran Out of Food in the Last Year: Never true  Transportation Needs: No Transportation Needs (12/19/2022)   PRAPARE - Administrator, Civil Service (Medical): No    Lack of Transportation (Non-Medical): No  Physical Activity: Insufficiently Active (12/19/2022)   Exercise Vital Sign    Days of Exercise per Week: 2 days    Minutes of Exercise per Session: 50 min  Stress: No Stress Concern Present (12/19/2022)   [employer] of Occupational Health - Occupational Stress Questionnaire    Feeling of Stress : Only a little  Social Connections: Moderately Isolated (12/19/2022)   Social Connection and Isolation Panel [NHANES]    Frequency of Communication with Friends and Family: More than three times a week    Frequency of Social Gatherings with Friends and Family: Once a week    Attends Religious Services: 1 to 4 times per year    Active Member of [employer] or Organizations: No    Attends Banker Meetings: Never    Marital Status: Never married  Intimate Partner Violence: Not At Risk (10/26/2022)   Humiliation, Afraid, Rape, and Kick questionnaire    Fear of Current or Ex-Partner: No    Emotionally Abused: No    Physically Abused: No    Sexually Abused: No    Family History  Problem Relation Age of Onset   Hypertension Mother    Breast cancer Mother        Mother was dx in 2009   Hypertension Father    Multiple myeloma Father 78   Asthma Brother    Pancreatic cancer Maternal Grandmother    Prostate cancer Maternal Grandfather     No past surgical history on file.  ROS: Review of Systems Negative except as stated above  PHYSICAL EXAM: BP (!) 141/84 (BP Location: Left Arm, Patient Position: Sitting, Cuff Size: Large)   Pulse (!) 122   Temp 98 F (36.7 C)  (Oral)   Ht 5\' 4"  (1.626 m)   Wt 268 lb (121.6 kg)   SpO2 100%   BMI 46.00 kg/m   Wt Readings from Last 3 Encounters:  12/20/22 268 lb (121.6 kg)  10/26/22 273 lb (123.8 kg)  07/05/22 265 lb (120.2 kg)  BP 153/85  Physical Exam  General appearance - alert, well appearing, and in no distress.  She has mild audible congestion Mental status - normal mood, behavior, speech, dress, motor activity, and thought processes Nose -clear mucous in both nostrils.  Mild enlargement of nasal Turbinates Mouth - mucous membranes moist, pharynx normal without lesions Neck - supple, no significant adenopathy Chest - clear to auscultation, no wheezes, rales or rhonchi,  symmetric air entry Heart -mild tachycardic but regular rhythm EKG: Sinus tachycardia otherwise looks good.     Latest Ref Rng & Units 06/26/2022    4:37 PM 10/28/2021    4:29 AM 10/26/2021    7:06 PM  CMP  Glucose 70 - 99 mg/dL 91  657  846   BUN 6 - 20 mg/dL 14  10  9    Creatinine 0.57 - 1.00 mg/dL 9.62  9.52  8.41   Sodium 134 - 144 mmol/L 138  136  139   Potassium 3.5 - 5.2 mmol/L 3.9  3.1  3.7   Chloride 96 - 106 mmol/L 104  106  107   CO2 20 - 29 mmol/L 20  25  25    Calcium 8.7 - 10.2 mg/dL 9.0  9.0  9.5   Total Protein 6.5 - 8.1 g/dL  7.9  8.5   Total Bilirubin 0.3 - 1.2 mg/dL  0.8  0.9   Alkaline Phos 38 - 126 U/L  91  99   AST 15 - 41 U/L  15  18   ALT 0 - 44 U/L  22  29    Lipid Panel  No results found for: "CHOL", "TRIG", "HDL", "CHOLHDL", "VLDL", "LDLCALC", "LDLDIRECT"  CBC    Component Value Date/Time   WBC 10.8 02/27/2022 1618   WBC 10.1 10/28/2021 0429   RBC 4.57 02/27/2022 1618   RBC 4.61 10/28/2021 0429   HGB 12.5 02/27/2022 1618   HCT 39.3 02/27/2022 1618   PLT 452 (H) 02/27/2022 1618   MCV 86 02/27/2022 1618   MCH 27.4 02/27/2022 1618   MCH 27.3 10/28/2021 0429   MCHC 31.8 02/27/2022 1618   MCHC 32.0 10/28/2021 0429   RDW 13.2 02/27/2022 1618   LYMPHSABS 2.3 12/19/2021 0951   MONOABS 0.7  10/28/2021 0429   EOSABS 0.2 12/19/2021 0951   BASOSABS 0.0 12/19/2021 0951    ASSESSMENT AND PLAN: 1. Sciatica, right side This has resolved.  Advised patient to start low and go slow with her exercise routines. - VITAMIN D 25 Hydroxy (Vit-D Deficiency, Fractures)  2. Acute upper respiratory infection Recommend using Vicks vapor rub over-the-counter as needed to help decrease nasal congestion.  Sudafed can increase blood pressure. Strongly encouraged her to get flu vaccine and COVID booster since she is prone to get upper respiratory infections easily.  She plans to get it at [facility]. - Novel Coronavirus, NAA ([facility]) - VITAMIN D 25 Hydroxy (Vit-D Deficiency, Fractures)  3. Tachycardia Stop Sudafed. May be due to the fact that she has an acute respiratory illness going on as well.  Stay hydrated. - LKG+[id number]   Patient was given the opportunity to ask questions.  Patient verbalized understanding of the plan and was able to repeat key elements of the plan.   This documentation was completed using Paediatric nurse.  Any transcriptional errors are unintentional.  No orders of the defined types were placed in this encounter.    Requested Prescriptions    No prescriptions requested or ordered in this encounter    No follow-ups on file.  Jonah Blue, MD, FACP

## 2022-12-21 ENCOUNTER — Other Ambulatory Visit: Payer: Self-pay | Admitting: Internal Medicine

## 2022-12-21 LAB — VITAMIN D 25 HYDROXY (VIT D DEFICIENCY, FRACTURES): Vit D, 25-Hydroxy: 13.3 ng/mL — ABNORMAL LOW (ref 30.0–100.0)

## 2022-12-21 LAB — TSH+T4F+T3FREE
Free T4: 1.17 ng/dL (ref 0.82–1.77)
T3, Free: 3.1 pg/mL (ref 2.0–4.4)
TSH: 2.69 u[IU]/mL (ref 0.450–4.500)

## 2022-12-21 LAB — NOVEL CORONAVIRUS, NAA: SARS-CoV-2, NAA: NOT DETECTED

## 2022-12-21 MED ORDER — VITAMIN D (ERGOCALCIFEROL) 1.25 MG (50000 UNIT) PO CAPS: 50000.0000 [IU] | ORAL_CAPSULE | ORAL | 1 refills | Status: DC

## 2023-01-15 ENCOUNTER — Other Ambulatory Visit: Payer: Self-pay | Admitting: Internal Medicine

## 2023-01-15 DIAGNOSIS — E039 Hypothyroidism, unspecified: Secondary | ICD-10-CM

## 2023-01-17 ENCOUNTER — Ambulatory Visit: Payer: 59 | Admitting: Pharmacist

## 2023-02-17 DIAGNOSIS — R0981 Nasal congestion: Secondary | ICD-10-CM | POA: Diagnosis not present

## 2023-02-17 DIAGNOSIS — J101 Influenza due to other identified influenza virus with other respiratory manifestations: Secondary | ICD-10-CM | POA: Diagnosis not present

## 2023-02-17 DIAGNOSIS — R509 Fever, unspecified: Secondary | ICD-10-CM | POA: Diagnosis not present

## 2023-02-17 DIAGNOSIS — H66002 Acute suppurative otitis media without spontaneous rupture of ear drum, left ear: Secondary | ICD-10-CM | POA: Diagnosis not present

## 2023-02-17 DIAGNOSIS — M791 Myalgia, unspecified site: Secondary | ICD-10-CM | POA: Diagnosis not present

## 2023-03-01 ENCOUNTER — Ambulatory Visit: Payer: BC Managed Care – PPO | Attending: Internal Medicine | Admitting: Internal Medicine

## 2023-03-01 DIAGNOSIS — I10 Essential (primary) hypertension: Secondary | ICD-10-CM | POA: Diagnosis not present

## 2023-03-01 DIAGNOSIS — Z23 Encounter for immunization: Secondary | ICD-10-CM | POA: Diagnosis not present

## 2023-03-01 DIAGNOSIS — L732 Hidradenitis suppurativa: Secondary | ICD-10-CM

## 2023-03-01 DIAGNOSIS — B009 Herpesviral infection, unspecified: Secondary | ICD-10-CM

## 2023-03-01 DIAGNOSIS — E559 Vitamin D deficiency, unspecified: Secondary | ICD-10-CM

## 2023-03-01 DIAGNOSIS — Z6841 Body Mass Index (BMI) 40.0 and over, adult: Secondary | ICD-10-CM | POA: Diagnosis not present

## 2023-03-01 DIAGNOSIS — N649 Disorder of breast, unspecified: Secondary | ICD-10-CM

## 2023-03-01 DIAGNOSIS — E039 Hypothyroidism, unspecified: Secondary | ICD-10-CM | POA: Diagnosis not present

## 2023-03-01 MED ORDER — VALACYCLOVIR HCL 1 G PO TABS: 1000.0000 mg | ORAL_TABLET | Freq: Three times a day (TID) | ORAL | 3 refills | Status: AC

## 2023-03-01 MED ORDER — SULFAMETHOXAZOLE-TRIMETHOPRIM 800-160 MG PO TABS: 1.0000 | ORAL_TABLET | Freq: Two times a day (BID) | ORAL | 0 refills | Status: DC

## 2023-03-01 MED ORDER — SYNTHROID 50 MCG PO TABS: ORAL_TABLET | ORAL | 1 refills | Status: DC

## 2023-03-01 MED ORDER — FLUTICASONE PROPIONATE 50 MCG/ACT NA SUSP: 1.0000 | Freq: Every day | NASAL | 1 refills | Status: DC | PRN

## 2023-03-01 MED ORDER — AMLODIPINE BESYLATE 5 MG PO TABS: ORAL_TABLET | ORAL | 2 refills | Status: DC

## 2023-03-01 NOTE — Progress Notes (Signed)
Patient ID: Sarah Hart, female    DOB: 1995/04/14  MRN: [id number]  CC: Hypothyroidism (Hypothyroidism f/u. Med refill./Pt brought home BP machine /No to flu vax)   Subjective: Sarah Hart is a 28 y.o. female who presents for chronic ds management. Her concerns today include:  Hx of hypothyroid, fhx of breast CA in mother, obesity, anxiety, vit D def, hidradenitis suppurativa  Discussed the use of AI scribe software for clinical note transcription with the patient, who gave verbal consent to proceed.  History of Present Illness    presents for f/u elevated blood pressure readings during medical visits. She reports experiencing anxiety during these visits, which she believes contributes to the high readings.  Checks blood pressure about once a week.  Some of her recent readings are 133/90, 123/86, 129/87, 108/61, 112/81, 118/86.  She limits salt in the foods.  She has her home blood pressure device with her today so that we can check blood pressure with our device and with hers.  The patient reports a recent skin lesion on the medial aspect of her left breast that has been present for a few days.  She was able to squeeze a little bit of pus out of it last evening.  Gives history of hidradenitis suppurativa diagnosed by dermatology several years ago.  Gets similar lesions in the axilla and had 1 on the left breast before as well.  She thinks it is due to hidradenitis suppurativa but wanted to get it checked as she does have a family history of breast cancer in her mother who was diagnosed in her early 29s.    GAD: currently managed with Zoloft, and she reports feeling less worried and anxious since with the med. However, she does note experiencing increased anxiety and mood changes before her menstrual cycle.   Obesity:  The patient also reports a goal of losing weight and has been making dietary changes and increasing physical activity to achieve this goal.  Down 7 pounds since our  last visit in October.  She has set a goal to get to about 190 to 200 pounds.  She has been eating more fruits and vegetables, grilled meat and salmon.  Trying to do better with choosing healthier snacks.  Just started going back to the gym again.  She walks on the treadmill for about 60 minutes.  Plans to go 2-3 times a week.  The patient is also being treated for hypothyroidism with Synthroid and reports needing a refill of this medication. She also reports taking Flonase and Valtrex as needed and requests refills.     Vitamin D deficiency: Was found to have vitamin D level of 13 on last visit.  Started on high-dose vitamin D once a week.  She has been taking the medicine.  Patient Active Problem List   Diagnosis Date Noted   Family history of breast cancer in mother 12/20/2017   Irregular menses 07/23/2017   Class 3 severe obesity without serious comorbidity with body mass index (BMI) of 40.0 to 44.9 in adult [facility]) 07/23/2017   Acquired hypothyroidism 02/08/2017   Allergic rhinitis 02/08/2017     Current Outpatient Medications on File Prior to Visit  Medication Sig Dispense Refill   hydrOXYzine (ATARAX) 10 MG tablet Take 1 tablet (10 mg total) by mouth at bedtime as needed. 30 tablet 3   sertraline (ZOLOFT) 50 MG tablet TAKE 1 TABLET BY MOUTH DAILY 90 tablet 1   Vitamin D, Ergocalciferol, (DRISDOL) 1.25 MG (50000 UNIT)  CAPS capsule Take 1 capsule (50,000 Units total) by mouth every 7 (seven) days. 12 capsule 1   No current facility-administered medications on file prior to visit.    No Known Allergies  Social History   Socioeconomic History   Marital status: Single    Spouse name: Not on file   Number of children: Not on file   Years of education: Not on file   Highest education level: Master's degree (e.g., MA, MS, MEng, MEd, MSW, MBA)  Occupational History   Not on file  Tobacco Use   Smoking status: Never    Passive exposure: Never   Smokeless tobacco: Never  Vaping Use    Vaping status: Never Used  Substance and Sexual Activity   Alcohol use: Not Currently    Comment: once or twice a month   Drug use: No   Sexual activity: Never    Partners: Male    Comment: menarche 12yo  Other Topics Concern   Not on file  Social History Narrative   Not on file   Social Drivers of Health   Financial Resource Strain: Medium Risk (03/01/2023)   Overall Financial Resource Strain (CARDIA)    Difficulty of Paying Living Expenses: Somewhat hard  Food Insecurity: Food Insecurity Present (03/01/2023)   Hunger Vital Sign    Worried About Running Out of Food in the Last Year: Sometimes true    Ran Out of Food in the Last Year: Never true  Transportation Needs: No Transportation Needs (03/01/2023)   PRAPARE - Administrator, Civil Service (Medical): No    Lack of Transportation (Non-Medical): No  Physical Activity: Insufficiently Active (03/01/2023)   Exercise Vital Sign    Days of Exercise per Week: 2 days    Minutes of Exercise per Session: 50 min  Stress: No Stress Concern Present (03/01/2023)   [employer] of Occupational Health - Occupational Stress Questionnaire    Feeling of Stress : Only a little  Social Connections: Moderately Isolated (03/01/2023)   Social Connection and Isolation Panel [NHANES]    Frequency of Communication with Friends and Family: Three times a week    Frequency of Social Gatherings with Friends and Family: More than three times a week    Attends Religious Services: 1 to 4 times per year    Active Member of [employer] or Organizations: No    Attends Banker Meetings: Never    Marital Status: Never married  Intimate Partner Violence: Not At Risk (10/26/2022)   Humiliation, Afraid, Rape, and Kick questionnaire    Fear of Current or Ex-Partner: No    Emotionally Abused: No    Physically Abused: No    Sexually Abused: No    Family History  Problem Relation Age of Onset   Hypertension Mother    Breast cancer  Mother        Mother was dx in 2009   Hypertension Father    Multiple myeloma Father 31   Asthma Brother    Pancreatic cancer Maternal Grandmother    Prostate cancer Maternal Grandfather     No past surgical history on file.  ROS: Review of Systems Negative except as stated above  PHYSICAL EXAM: BP (!) 133/95   Pulse (!) 111   Temp 97.7 F (36.5 C) (Oral)   Ht 5\' 4"  (1.626 m)   Wt 266 lb (120.7 kg)   SpO2 100%   BMI 45.66 kg/m   Wt Readings from Last 3 Encounters:  03/01/23 266  lb (120.7 kg)  12/20/22 268 lb (121.6 kg)  10/26/22 273 lb (123.8 kg)    Physical Exam Blood pressure using our device was 160/86.  Blood pressure using her automated arm device was 133/95. General appearance - alert, well appearing, and in no distress Mental status - normal mood, behavior, speech, dress, motor activity, and thought processes Chest - clear to auscultation, no wheezes, rales or rhonchi, symmetric air entry Heart - normal rate, regular rhythm, normal S1, S2, no murmurs, rubs, clicks or gallops      Latest Ref Rng & Units 06/26/2022    4:37 PM 10/28/2021    4:29 AM 10/26/2021    7:06 PM  CMP  Glucose 70 - 99 mg/dL 91  409  811   BUN 6 - 20 mg/dL 14  10  9    Creatinine 0.57 - 1.00 mg/dL 9.14  7.82  9.56   Sodium 134 - 144 mmol/L 138  136  139   Potassium 3.5 - 5.2 mmol/L 3.9  3.1  3.7   Chloride 96 - 106 mmol/L 104  106  107   CO2 20 - 29 mmol/L 20  25  25    Calcium 8.7 - 10.2 mg/dL 9.0  9.0  9.5   Total Protein 6.5 - 8.1 g/dL  7.9  8.5   Total Bilirubin 0.3 - 1.2 mg/dL  0.8  0.9   Alkaline Phos 38 - 126 U/L  91  99   AST 15 - 41 U/L  15  18   ALT 0 - 44 U/L  22  29    Lipid Panel  No results found for: "CHOL", "TRIG", "HDL", "CHOLHDL", "VLDL", "LDLCALC", "LDLDIRECT"  CBC    Component Value Date/Time   WBC 10.8 02/27/2022 1618   WBC 10.1 10/28/2021 0429   RBC 4.57 02/27/2022 1618   RBC 4.61 10/28/2021 0429   HGB 12.5 02/27/2022 1618   HCT 39.3 02/27/2022 1618    PLT 452 (H) 02/27/2022 1618   MCV 86 02/27/2022 1618   MCH 27.4 02/27/2022 1618   MCH 27.3 10/28/2021 0429   MCHC 31.8 02/27/2022 1618   MCHC 32.0 10/28/2021 0429   RDW 13.2 02/27/2022 1618   LYMPHSABS 2.3 12/19/2021 0951   MONOABS 0.7 10/28/2021 0429   EOSABS 0.2 12/19/2021 0951   BASOSABS 0.0 12/19/2021 0951    ASSESSMENT AND PLAN: 1. Morbid obesity with body mass index (BMI) of 40.0 or higher (HCC) (Primary) Commended her on positive changes that she is made in her eating habits and weight loss that she has achieved so far.  Encouraged her to keep up the good works.  Encouraged her to continue exercise routine that she has started.  2. White coat syndrome with hypertension Patient with elevation in diastolic blood pressure on most of her home readings.  She does have a component of whitecoat hypertension.  We discussed starting her on low-dose amlodipine and have her check blood pressure once a week.  She will send me some of her readings after being on the amlodipine for 3 weeks - amLODipine (NORVASC) 5 MG tablet; 1/2 tab PO daily  Dispense: 30 tablet; Refill: 2  3. Acquired hypothyroidism - SYNTHROID 50 MCG tablet; TAKE 1 TABLET(50 MCG) BY MOUTH DAILY  Dispense: 90 tablet; Refill: 1  4. Hidradenitis suppurativa Very small area that appears inflamed on the outer edge of the left breast medially.  We will put her on some antibiotic for 1 week.  If it does not resolve post  antibiotics, she will let me know so that we can order an ultrasound of the breast. - sulfamethoxazole-trimethoprim (BACTRIM DS) 800-160 MG tablet; Take 1 tablet by mouth 2 (two) times daily.  Dispense: 14 tablet; Refill: 0  5. Breast lesion See #4 above - sulfamethoxazole-trimethoprim (BACTRIM DS) 800-160 MG tablet; Take 1 tablet by mouth 2 (two) times daily.  Dispense: 14 tablet; Refill: 0  6. Recurrent HSV (herpes simplex virus) - valACYclovir (VALTREX) 1000 MG tablet; Take 1 tablet (1,000 mg total) by mouth 3  (three) times daily.  Dispense: 21 tablet; Refill: 3  7. Vitamin D deficiency Continue high-dose vitamin D once a week  8. Encounter for immunization - Flu vaccine trivalent PF, 6mos and older(Flulaval,Afluria,Fluarix,Fluzone)    Patient was given the opportunity to ask questions.  Patient verbalized understanding of the plan and was able to repeat key elements of the plan.   This documentation was completed using Paediatric nurse.  Any transcriptional errors are unintentional.  Orders Placed This Encounter  Procedures   Flu vaccine trivalent PF, 6mos and older(Flulaval,Afluria,Fluarix,Fluzone)     Requested Prescriptions   Signed Prescriptions Disp Refills   fluticasone (FLONASE) 50 MCG/ACT nasal spray 16 g 1    Sig: Place 1 spray into both nostrils daily as needed for allergies or rhinitis.   SYNTHROID 50 MCG tablet 90 tablet 1    Sig: TAKE 1 TABLET(50 MCG) BY MOUTH DAILY   sulfamethoxazole-trimethoprim (BACTRIM DS) 800-160 MG tablet 14 tablet 0    Sig: Take 1 tablet by mouth 2 (two) times daily.   valACYclovir (VALTREX) 1000 MG tablet 21 tablet 3    Sig: Take 1 tablet (1,000 mg total) by mouth 3 (three) times daily.   amLODipine (NORVASC) 5 MG tablet 30 tablet 2    Sig: 1/2 tab PO daily    Return in about 4 months (around 06/29/2023).  Jonah Blue, MD, FACP

## 2023-03-01 NOTE — Patient Instructions (Signed)
Please take the antibiotics as prescribed.  If the lesion on the left breast does not resolve with the antibiotics, please let me know so that we can refer you for breast ultrasound.  Start amlodipine 5 mg half a tablet daily for blood pressure.  Please check your blood pressure once or twice a week.  After about 3 weeks, please send me some of your readings.

## 2023-03-08 ENCOUNTER — Encounter: Payer: Self-pay | Admitting: Internal Medicine

## 2023-03-09 ENCOUNTER — Other Ambulatory Visit: Payer: Self-pay | Admitting: Internal Medicine

## 2023-03-09 DIAGNOSIS — R0981 Nasal congestion: Secondary | ICD-10-CM

## 2023-03-18 ENCOUNTER — Encounter: Payer: Self-pay | Admitting: Internal Medicine

## 2023-03-19 ENCOUNTER — Other Ambulatory Visit: Payer: Self-pay | Admitting: Internal Medicine

## 2023-03-19 DIAGNOSIS — N649 Disorder of breast, unspecified: Secondary | ICD-10-CM

## 2023-03-22 ENCOUNTER — Telehealth (INDEPENDENT_AMBULATORY_CARE_PROVIDER_SITE_OTHER): Payer: Self-pay | Admitting: Otolaryngology

## 2023-03-22 NOTE — Telephone Encounter (Signed)
 LVM to confirm appt & location [id number] afm

## 2023-03-25 ENCOUNTER — Institutional Professional Consult (permissible substitution) (INDEPENDENT_AMBULATORY_CARE_PROVIDER_SITE_OTHER)

## 2023-04-23 ENCOUNTER — Ambulatory Visit
Admission: RE | Admit: 2023-04-23 | Discharge: 2023-04-23 | Disposition: A | Discharge status: Home / Self Care | Source: Ambulatory Visit | Attending: Internal Medicine | Admitting: Internal Medicine

## 2023-04-23 DIAGNOSIS — N6489 Other specified disorders of breast: Secondary | ICD-10-CM | POA: Diagnosis not present

## 2023-04-23 DIAGNOSIS — Z803 Family history of malignant neoplasm of breast: Secondary | ICD-10-CM | POA: Diagnosis not present

## 2023-04-23 DIAGNOSIS — N649 Disorder of breast, unspecified: Secondary | ICD-10-CM

## 2023-04-24 ENCOUNTER — Encounter: Payer: Self-pay | Admitting: Internal Medicine

## 2023-04-29 DIAGNOSIS — J020 Streptococcal pharyngitis: Secondary | ICD-10-CM | POA: Diagnosis not present

## 2023-04-29 DIAGNOSIS — J029 Acute pharyngitis, unspecified: Secondary | ICD-10-CM | POA: Diagnosis not present

## 2023-05-30 ENCOUNTER — Encounter (INDEPENDENT_AMBULATORY_CARE_PROVIDER_SITE_OTHER): Payer: Self-pay | Admitting: Otolaryngology

## 2023-05-30 ENCOUNTER — Ambulatory Visit (INDEPENDENT_AMBULATORY_CARE_PROVIDER_SITE_OTHER): Admitting: Otolaryngology

## 2023-05-30 VITALS — BP 145/89 | HR 100 | Ht 64.0 in | Wt 263.0 lb

## 2023-05-30 DIAGNOSIS — J310 Chronic rhinitis: Secondary | ICD-10-CM

## 2023-05-30 DIAGNOSIS — R0981 Nasal congestion: Secondary | ICD-10-CM | POA: Diagnosis not present

## 2023-05-30 DIAGNOSIS — J342 Deviated nasal septum: Secondary | ICD-10-CM | POA: Diagnosis not present

## 2023-05-30 DIAGNOSIS — J343 Hypertrophy of nasal turbinates: Secondary | ICD-10-CM | POA: Diagnosis not present

## 2023-05-30 MED ORDER — FLUTICASONE PROPIONATE 50 MCG/ACT NA SUSP: 2.0000 | Freq: Every day | NASAL | 10 refills | Status: AC

## 2023-06-01 DIAGNOSIS — J310 Chronic rhinitis: Secondary | ICD-10-CM | POA: Insufficient documentation

## 2023-06-01 DIAGNOSIS — J342 Deviated nasal septum: Secondary | ICD-10-CM | POA: Insufficient documentation

## 2023-06-01 DIAGNOSIS — J343 Hypertrophy of nasal turbinates: Secondary | ICD-10-CM | POA: Insufficient documentation

## 2023-06-01 NOTE — Progress Notes (Signed)
 CC: Chronic nasal congestion, recurrent sinusitis  HPI:  Sarah Hart is a 28 y.o. female who presents today complaining of chronic nasal congestion and recurrent sinusitis.  She has been symptomatic for several years.  She also complains of decreased sense of smell and chronic postnasal drainage.  She has a history of environmental allergies since her childhood.  She uses Flonase  and Claritin as needed.  Her last antibiotic was in November 2024.  She has no previous ENT surgery.  Past Medical History:  Diagnosis Date   Anxiety    Hypothyroidism     History reviewed. No pertinent surgical history.  Family History  Problem Relation Age of Onset   Hypertension Mother    Breast cancer Mother        Mother was dx in 2009   Hypertension Father    Multiple myeloma Father 74   Asthma Brother    Pancreatic cancer Maternal Grandmother    Prostate cancer Maternal Grandfather     Social History:  reports that she has never smoked. She has never been exposed to tobacco smoke. She has never used smokeless tobacco. She reports that she does not currently use alcohol. She reports that she does not use drugs.  Allergies: No Known Allergies  Prior to Admission medications   Medication Sig Start Date End Date Taking? Authorizing Provider  amLODipine  (NORVASC ) 5 MG tablet 1/2 tab PO daily 03/01/23  Yes Lawrance Presume, MD  fluticasone  (FLONASE ) 50 MCG/ACT nasal spray Place 1 spray into both nostrils daily as needed for allergies or rhinitis. 03/01/23  Yes Lawrance Presume, MD  fluticasone  (FLONASE ) 50 MCG/ACT nasal spray Place 2 sprays into both nostrils daily. 05/30/23 06/29/23 Yes Reynold Caves, MD  hydrOXYzine  (ATARAX ) 10 MG tablet Take 1 tablet (10 mg total) by mouth at bedtime as needed. 11/15/22  Yes Lawrance Presume, MD  sertraline  (ZOLOFT ) 50 MG tablet TAKE 1 TABLET BY MOUTH DAILY 11/15/22  Yes Lawrance Presume, MD  sulfamethoxazole -trimethoprim  (BACTRIM  DS) 800-160 MG tablet Take 1  tablet by mouth 2 (two) times daily. 03/01/23  Yes Lawrance Presume, MD  SYNTHROID  50 MCG tablet TAKE 1 TABLET(50 MCG) BY MOUTH DAILY 03/01/23  Yes Lawrance Presume, MD  valACYclovir  (VALTREX ) 1000 MG tablet Take 1 tablet (1,000 mg total) by mouth 3 (three) times daily. 03/01/23  Yes Lawrance Presume, MD  Vitamin D , Ergocalciferol , (DRISDOL ) 1.25 MG (50000 UNIT) CAPS capsule Take 1 capsule (50,000 Units total) by mouth every 7 (seven) days. 12/21/22  Yes Lawrance Presume, MD    Blood pressure (!) 145/89, pulse 100, height 5\' 4"  (1.626 m), weight 263 lb (119.3 kg), SpO2 96%. Exam: General: Communicates without difficulty, well nourished, no acute distress. Head: Normocephalic, no evidence injury, no tenderness, facial buttresses intact without stepoff. Face/sinus: No tenderness to palpation and percussion. Facial movement is normal and symmetric. Eyes: PERRL, EOMI. No scleral icterus, conjunctivae clear. Neuro: CN II exam reveals vision grossly intact.  No nystagmus at any point of gaze. Ears: Auricles well formed without lesions.  Ear canals are intact without mass or lesion.  No erythema or edema is appreciated.  The TMs are intact without fluid. Nose: External evaluation reveals normal support and skin without lesions.  Dorsum is intact.  Anterior rhinoscopy reveals congested mucosa over anterior aspect of inferior turbinates and deviated septum.  No purulence noted. Oral:  Oral cavity and oropharynx are intact, symmetric, without erythema or edema.  Mucosa is moist without lesions. Neck: Full  range of motion without pain.  There is no significant lymphadenopathy.  No masses palpable.  Thyroid  bed within normal limits to palpation.  Parotid glands and submandibular glands equal bilaterally without mass.  Trachea is midline. Neuro:  CN 2-12 grossly intact.   Procedure:  Flexible Nasal Endoscopy: Description: Risks, benefits, and alternatives of flexible endoscopy were explained to the patient.   Specific mention was made of the risk of throat numbness with difficulty swallowing, possible bleeding from the nose and mouth, and pain from the procedure.  The patient gave oral consent to proceed.  The flexible scope was inserted into the right nasal cavity.  Endoscopy of the interior nasal cavity, superior, inferior, and middle meatus was performed. The sphenoid-ethmoid recess was examined. Edematous mucosa was noted.  No polyp, mass, or lesion was appreciated. Nasal septal deviation noted. Olfactory cleft was clear.  Nasopharynx was clear.  Turbinates were hypertrophied but without mass.  The procedure was repeated on the contralateral side with similar findings.  The patient tolerated the procedure well.    Assessment: 1.  Chronic rhinitis with nasal mucosal congestion, nasal septal deviation, and bilateral inferior turbinate hypertrophy.  More than 95% of the nasal passageways are obstructed bilaterally. 2.  History of recurrent sinusitis.  However, no acute infection or purulent drainage is noted today.  Plan: 1.  The physical exam and nasal endoscopy findings are reviewed with the patient. 2.  Flonase  nasal spray 2 sprays each nostril daily.  The importance of consistent daily use is discussed. 3.  Nasal saline irrigation is encouraged. 4.  The patient will return for reevaluation in 6 weeks.  If she continues to be symptomatic, she may benefit from surgical intervention with septoplasty and turbinate reduction.  Tyrail Grandfield W Quanah Majka 06/01/2023, 12:43 PM

## 2023-07-01 ENCOUNTER — Ambulatory Visit: Payer: BC Managed Care – PPO | Admitting: Internal Medicine

## 2023-07-23 ENCOUNTER — Ambulatory Visit (INDEPENDENT_AMBULATORY_CARE_PROVIDER_SITE_OTHER): Admitting: Otolaryngology

## 2023-07-23 ENCOUNTER — Encounter (INDEPENDENT_AMBULATORY_CARE_PROVIDER_SITE_OTHER): Payer: Self-pay | Admitting: Otolaryngology

## 2023-07-23 VITALS — BP 137/82 | HR 88 | Ht 63.0 in | Wt 267.0 lb

## 2023-07-23 DIAGNOSIS — J310 Chronic rhinitis: Secondary | ICD-10-CM

## 2023-07-23 DIAGNOSIS — J343 Hypertrophy of nasal turbinates: Secondary | ICD-10-CM | POA: Diagnosis not present

## 2023-07-23 DIAGNOSIS — J342 Deviated nasal septum: Secondary | ICD-10-CM | POA: Diagnosis not present

## 2023-07-23 DIAGNOSIS — R0981 Nasal congestion: Secondary | ICD-10-CM

## 2023-07-24 ENCOUNTER — Encounter: Payer: Self-pay | Admitting: Internal Medicine

## 2023-07-24 ENCOUNTER — Other Ambulatory Visit: Payer: Self-pay | Admitting: Internal Medicine

## 2023-07-24 DIAGNOSIS — E039 Hypothyroidism, unspecified: Secondary | ICD-10-CM

## 2023-07-24 NOTE — Progress Notes (Signed)
 Patient ID: Sarah Hart, female   DOB: [birth year], 28 y.o.   MRN: [id number]  Follow-up: Chronic nasal congestion, recurrent sinusitis  HPI: The patient is a 28 year old female who returns today for her follow-up evaluation.  She was last seen in May 2025.  At that time, she was complaining of chronic nasal congestion and recurrent sinusitis.  She was noted to have nasal mucosal congestion, nasal septal deviation, and bilateral inferior turbinate hypertrophy.  She was treated with Flonase  nasal spray and nasal saline irrigation.  The patient returns today reporting improvement in her nasal breathing.  She has not had any recent sinusitis.  Currently she denies any facial pain, fever, or visual change.  Exam: General: Communicates without difficulty, well nourished, no acute distress. Head: Normocephalic, no evidence injury, no tenderness, facial buttresses intact without stepoff. Face/sinus: No tenderness to palpation and percussion. Facial movement is normal and symmetric. Eyes: PERRL, EOMI. No scleral icterus, conjunctivae clear. Neuro: CN II exam reveals vision grossly intact.  No nystagmus at any point of gaze. Ears: Auricles well formed without lesions.  Ear canals are intact without mass or lesion.  No erythema or edema is appreciated.  The TMs are intact without fluid. Nose: External evaluation reveals normal support and skin without lesions.  Dorsum is intact.  Anterior rhinoscopy reveals congested mucosa over anterior aspect of inferior turbinates and deviated septum.  No purulence noted. Oral:  Oral cavity and oropharynx are intact, symmetric, without erythema or edema.  Mucosa is moist without lesions. Neck: Full range of motion without pain.  There is no significant lymphadenopathy.  No masses palpable.  Thyroid  bed within normal limits to palpation.  Parotid glands and submandibular glands equal bilaterally without mass.  Trachea is midline. Neuro:  CN 2-12 grossly intact.   Assessment: 1.   Chronic rhinitis with nasal mucosal congestion, nasal septal deviation, and bilateral inferior turbinate hypertrophy.  The severity of her nasal congestion has decreased. 2.  No acute infection is noted today.  Plan: 1.  The physical exam findings are reviewed with the patient. 2.  Continue with Flonase  nasal spray and nasal saline irrigation daily. 3.  The patient will return for reevaluation in 6 months, sooner if needed.

## 2023-07-25 ENCOUNTER — Other Ambulatory Visit

## 2023-07-26 ENCOUNTER — Other Ambulatory Visit: Payer: Self-pay | Admitting: *Deleted

## 2023-07-26 ENCOUNTER — Other Ambulatory Visit (INDEPENDENT_AMBULATORY_CARE_PROVIDER_SITE_OTHER)

## 2023-07-26 DIAGNOSIS — E039 Hypothyroidism, unspecified: Secondary | ICD-10-CM | POA: Diagnosis not present

## 2023-07-26 NOTE — Progress Notes (Signed)
Patient came in for labs Patient tolerated well

## 2023-07-27 ENCOUNTER — Ambulatory Visit: Payer: Self-pay | Admitting: Internal Medicine

## 2023-07-27 LAB — TSH: TSH: 2.59 u[IU]/mL (ref 0.450–4.500)

## 2023-08-01 ENCOUNTER — Telehealth: Payer: Self-pay | Admitting: Internal Medicine

## 2023-08-01 NOTE — Telephone Encounter (Signed)
 Called patient to confirm upcoming appointment, 08/05/2023 at 2:30 pm with Dr. Vicci. Patient appointment has been successfully confirmed.

## 2023-08-05 ENCOUNTER — Ambulatory Visit: Attending: Internal Medicine | Admitting: Internal Medicine

## 2023-08-05 ENCOUNTER — Encounter: Payer: Self-pay | Admitting: Internal Medicine

## 2023-08-05 VITALS — BP 123/84 | HR 86 | Wt 260.8 lb

## 2023-08-05 DIAGNOSIS — E039 Hypothyroidism, unspecified: Secondary | ICD-10-CM

## 2023-08-05 DIAGNOSIS — R140 Abdominal distension (gaseous): Secondary | ICD-10-CM | POA: Diagnosis not present

## 2023-08-05 DIAGNOSIS — Z6841 Body Mass Index (BMI) 40.0 and over, adult: Secondary | ICD-10-CM

## 2023-08-05 DIAGNOSIS — I10 Essential (primary) hypertension: Secondary | ICD-10-CM | POA: Diagnosis not present

## 2023-08-05 DIAGNOSIS — F411 Generalized anxiety disorder: Secondary | ICD-10-CM | POA: Diagnosis not present

## 2023-08-05 DIAGNOSIS — F330 Major depressive disorder, recurrent, mild: Secondary | ICD-10-CM | POA: Diagnosis not present

## 2023-08-05 MED ORDER — SERTRALINE HCL 100 MG PO TABS: ORAL_TABLET | ORAL | 1 refills | Status: AC

## 2023-08-05 NOTE — Progress Notes (Signed)
 Patient ID: Sarah Hart, female    DOB: [birth year]  MRN: [id number]  CC: Abdominal Pain and Medical Management of Chronic Issues   Subjective: Sarah Hart is a 28 y.o. female who presents for chronic ds management. Her concerns today include:  Hx of hypothyroid, fhx of breast CA in mother, obesity, anxiety, vit D def, hidradenitis suppurativa, HSV, Vit D def, PCOS  Discussed the use of AI scribe software for clinical note transcription with the patient, who gave verbal consent to proceed.  History of Present Illness Sarah Hart is a 28 year old female who presents chronic ds management and issues with  abdominal bloating.  She has been experiencing abdominal bloating and discomfort for the past week and a half, described as a 'bloated feeling' rather than severe pain, with occasional tenderness near the right ovary. She has a history of a para ovarian cyst diagnosed in 2023, which she suspects might be contributing to her symptoms. No nausea, vomiting, diarrhea, constipation, pain when eating, heartburn, or acid reflux, although she occasionally belches. The bloating sometimes occurs after eating, particularly with refined carbohydrates, which she tries to avoid.  Her menstrual cycle has been irregular this year. Her periods were regular until May, but she only experienced spotting in June and has not had a period in July. She notes that her periods sometimes skip a month and then return. Has hx of PCOS. She plans to schedule a f/u with her gyn for this and f/u on ovarian cyst  She has a history of hypertension with whitecoat component and was prescribed amlodipine  5 mg on her last visit, which she has not been taking. She prefers to manage her blood pressure through lifestyle changes, including monitoring her blood pressure weekly and maintaining a healthier diet. She is actively limiting her salt intake.  GAD/MDD: She has been diagnosed with anxiety and persistent  depressive disorder. Her anxiety has increased recently, which she attributes to her father's recent cancer diagnosis. She is currently taking Zoloft  50 mg daily and hydroxyzine  as needed for anxiety, particularly at night. She reports difficulty falling and staying asleep due to anxiety. Her depression is generally manageable, though she experiences low moods premenstrually.  She feels she would benefit from a higher dose of Zoloft .  Obesity: She has lost 6 pounds since February, now weighing 260 pounds. She attributes this to improved eating habits and increased physical activity, although she has been less active recently due to low mood and hot weather. She aims to exercise at least three times a week, either at the gym or by walking.  Hypothyroidism: She continues to take Synthroid  50 mcg daily.  Last TSH level was within normal range.    Patient Active Problem List   Diagnosis Date Noted   Chronic rhinitis 06/01/2023   Deviated nasal septum 06/01/2023   Hypertrophy of nasal turbinates 06/01/2023   Family history of breast cancer in mother 12/20/2017   Irregular menses 07/23/2017   Class 3 severe obesity without serious comorbidity with body mass index (BMI) of 40.0 to 44.9 in adult 07/23/2017   Acquired hypothyroidism 02/08/2017   Allergic rhinitis 02/08/2017     Current Outpatient Medications on File Prior to Visit  Medication Sig Dispense Refill   hydrOXYzine  (ATARAX ) 10 MG tablet Take 1 tablet (10 mg total) by mouth at bedtime as needed. 30 tablet 3   SYNTHROID  50 MCG tablet TAKE 1 TABLET(50 MCG) BY MOUTH DAILY 90 tablet 1   valACYclovir  (VALTREX ) 1000 MG tablet  Take 1 tablet (1,000 mg total) by mouth 3 (three) times daily. 21 tablet 3   Vitamin D , Ergocalciferol , (DRISDOL ) 1.25 MG (50000 UNIT) CAPS capsule Take 1 capsule (50,000 Units total) by mouth every 7 (seven) days. 12 capsule 1   amLODipine  (NORVASC ) 5 MG tablet 1/2 tab PO daily (Patient not taking: Reported on 08/05/2023)  30 tablet 2   fluticasone  (FLONASE ) 50 MCG/ACT nasal spray Place 2 sprays into both nostrils daily. 16 g 10   No current facility-administered medications on file prior to visit.    No Known Allergies  Social History   Socioeconomic History   Marital status: Single    Spouse name: Not on file   Number of children: Not on file   Years of education: Not on file   Highest education level: Master's degree (e.g., MA, MS, MEng, MEd, MSW, MBA)  Occupational History   Not on file  Tobacco Use   Smoking status: Never    Passive exposure: Never   Smokeless tobacco: Never  Vaping Use   Vaping status: Never Used  Substance and Sexual Activity   Alcohol use: Not Currently    Comment: once or twice a month   Drug use: No   Sexual activity: Never    Partners: Male    Comment: menarche 12yo  Other Topics Concern   Not on file  Social History Narrative   Not on file   Social Drivers of Health   Financial Resource Strain: Medium Risk (07/30/2023)   Overall Financial Resource Strain (CARDIA)    Difficulty of Paying Living Expenses: Somewhat hard  Food Insecurity: Food Insecurity Present (07/30/2023)   Hunger Vital Sign    Worried About Running Out of Food in the Last Year: Sometimes true    Ran Out of Food in the Last Year: Never true  Transportation Needs: No Transportation Needs (07/30/2023)   PRAPARE - Administrator, Civil Service (Medical): No    Lack of Transportation (Non-Medical): No  Physical Activity: Insufficiently Active (07/30/2023)   Exercise Vital Sign    Days of Exercise per Week: 2 days    Minutes of Exercise per Session: 60 min  Stress: Stress Concern Present (07/30/2023)   [employer] of Occupational Health - Occupational Stress Questionnaire    Feeling of Stress: Rather much  Social Connections: Moderately Isolated (07/30/2023)   Social Connection and Isolation Panel    Frequency of Communication with Friends and Family: More than three times a  week    Frequency of Social Gatherings with Friends and Family: Three times a week    Attends Religious Services: 1 to 4 times per year    Active Member of Clubs or Organizations: No    Attends Banker Meetings: Not on file    Marital Status: Never married  Intimate Partner Violence: Not At Risk (10/26/2022)   Humiliation, Afraid, Rape, and Kick questionnaire    Fear of Current or Ex-Partner: No    Emotionally Abused: No    Physically Abused: No    Sexually Abused: No    Family History  Problem Relation Age of Onset   Hypertension Mother    Breast cancer Mother        Mother was dx in 2009   Hypertension Father    Multiple myeloma Father 16   Asthma Brother    Pancreatic cancer Maternal Grandmother    Prostate cancer Maternal Grandfather     No past surgical history on file.  ROS:  Review of Systems Negative except as stated above  PHYSICAL EXAM: BP 123/84   Pulse 86   Wt 260 lb 12.8 oz (118.3 kg)   LMP 06/15/2023   SpO2 98%   BMI 46.20 kg/m   Wt Readings from Last 3 Encounters:  08/05/23 260 lb 12.8 oz (118.3 kg)  07/23/23 267 lb (121.1 kg)  05/30/23 263 lb (119.3 kg)    Physical Exam  General appearance - alert, well appearing, obese young AAFand in no distress Mental status - normal mood, behavior, speech, dress, motor activity, and thought processes Chest - clear to auscultation, no wheezes, rales or rhonchi, symmetric air entry Heart - normal rate, regular rhythm, normal S1, S2, no murmurs, rubs, clicks or gallops Abdomen - soft, nontender, nondistended, no masses or organomegaly Extremities - peripheral pulses normal, no pedal edema, no clubbing or cyanosis    08/05/2023    2:49 PM 03/01/2023    3:48 PM 12/20/2022    3:10 PM  Depression screen PHQ 2/9  Decreased Interest 1 1 1   Down, Depressed, Hopeless 2 1 1   PHQ - 2 Score 3 2 2   Altered sleeping 3 0 2  Tired, decreased energy 2 1 2   Change in appetite 0 0 1  Feeling bad or failure  about yourself  1 1 0  Trouble concentrating 2 1 2   Moving slowly or fidgety/restless 0 0 0  Suicidal thoughts 0 0 0  PHQ-9 Score 11 5 9   Difficult doing work/chores  Somewhat difficult Somewhat difficult      08/05/2023    2:49 PM 03/01/2023    3:48 PM 12/20/2022    3:10 PM 10/26/2022    4:29 PM  GAD 7 : Generalized Anxiety Score  Nervous, Anxious, on Edge 2 1 1 2   Control/stop worrying 2 1 1 1   Worry too much - different things 2 1 1 2   Trouble relaxing 2 0 0 0  Restless 0 0 0 0  Easily annoyed or irritable 0 1 1 1   Afraid - awful might happen 2 1 1 1   Total GAD 7 Score 10 5 5 7   Anxiety Difficulty  Somewhat difficult Somewhat difficult          Latest Ref Rng & Units 06/26/2022    4:37 PM 10/28/2021    4:29 AM 10/26/2021    7:06 PM  CMP  Glucose 70 - 99 mg/dL 91  862  898   BUN 6 - 20 mg/dL 14  10  9    Creatinine 0.57 - 1.00 mg/dL 9.17  9.11  9.17   Sodium 134 - 144 mmol/L 138  136  139   Potassium 3.5 - 5.2 mmol/L 3.9  3.1  3.7   Chloride 96 - 106 mmol/L 104  106  107   CO2 20 - 29 mmol/L 20  25  25    Calcium 8.7 - 10.2 mg/dL 9.0  9.0  9.5   Total Protein 6.5 - 8.1 g/dL  7.9  8.5   Total Bilirubin 0.3 - 1.2 mg/dL  0.8  0.9   Alkaline Phos 38 - 126 U/L  91  99   AST 15 - 41 U/L  15  18   ALT 0 - 44 U/L  22  29    Lipid Panel  No results found for: CHOL, TRIG, HDL, CHOLHDL, VLDL, LDLCALC, LDLDIRECT  CBC    Component Value Date/Time   WBC 10.8 02/27/2022 1618   WBC 10.1 10/28/2021 0429   RBC  4.57 02/27/2022 1618   RBC 4.61 10/28/2021 0429   HGB 12.5 02/27/2022 1618   HCT 39.3 02/27/2022 1618   PLT 452 (H) 02/27/2022 1618   MCV 86 02/27/2022 1618   MCH 27.4 02/27/2022 1618   MCH 27.3 10/28/2021 0429   MCHC 31.8 02/27/2022 1618   MCHC 32.0 10/28/2021 0429   RDW 13.2 02/27/2022 1618   LYMPHSABS 2.3 12/19/2021 0951   MONOABS 0.7 10/28/2021 0429   EOSABS 0.2 12/19/2021 0951   BASOSABS 0.0 12/19/2021 0951    ASSESSMENT AND  PLAN:  Assessment and Plan Assessment & Plan Abdominal Bloating Bloating postprandial, especially after refined carbohydrates. Differential includes dietary causes, anxiety-related tension, possible H. pylori infection.  - Order H. pylori breath test. - Advise monitoring dietary triggers, particularly refined carbohydrates and dairy. - Encourage follow-up with gynecologist regarding paraovarian cyst.  Hypertension Diastolic BP consistently >80 mmHg. Previously prescribed amlodipine  5 mg, not taken. Open to 2.5 mg. Monitoring BP and implementing lifestyle changes. - Recommend starting amlodipine  at 2.5 mg. - Continue monitoring blood pressure weekly. -Continue lifestyle modifications including salt restriction and weight loss.  Anxiety and Depression Anxiety and persistent depressive disorder exacerbated by stressors. Elevated PHQ-9. Increased nocturnal anxiety affecting sleep. On Zoloft  50 mg, uses hydroxyzine  as needed. Considers Zoloft  increase beneficial. - Increase Zoloft  to 100 mg. - Continue hydroxyzine  as needed for anxiety. - Encourage regular physical activity to help manage anxiety.  Hypothyroidism Thyroid  function well-managed with TSH 2.5. Consistently taking Synthroid . - Continue current dose of Synthroid .  Obesity Lost 6 pounds since last visit. Improving diet and physical activity despite challenges. - Encourage continued healthy eating and regular physical activity.     There are no diagnoses linked to this encounter.   Patient was given the opportunity to ask questions.  Patient verbalized understanding of the plan and was able to repeat key elements of the plan.   This documentation was completed using Paediatric nurse.  Any transcriptional errors are unintentional.  Orders Placed This Encounter  Procedures   H. pylori breath test     Requested Prescriptions   Signed Prescriptions Disp Refills   sertraline  (ZOLOFT ) 100 MG tablet 90  tablet 1    Sig: TAKE 1 TABLET BY MOUTH DAILY    Return in about 4 months (around 12/06/2023).  Barnie Louder, MD, FACP

## 2023-08-05 NOTE — Patient Instructions (Signed)
 VISIT SUMMARY:  During your visit, we discussed your abdominal bloating and irregular periods, hypertension, anxiety and depression, and general health maintenance. We reviewed your symptoms, current medications, and lifestyle habits to create a comprehensive plan to address your concerns.  YOUR PLAN:  -ABDOMINAL BLOATING: Abdominal bloating can be caused by various factors, including dietary triggers and anxiety. We will test for H. pylori infection and recommend monitoring your diet, especially avoiding refined carbohydrates and dairy. Please follow up with your gynecologist regarding your paraovarian cyst.  -HYPERTENSION: Hypertension, or high blood pressure, can lead to serious health issues if not managed. We recommend starting amlodipine  at 2.5 mg and continuing to monitor your blood pressure weekly. Keep up with lifestyle changes like reducing salt intake and aiming for weight loss.  -ANXIETY AND DEPRESSION: Anxiety and depression can significantly impact your quality of life. We will increase your Zoloft  dose to 100 mg and continue hydroxyzine  as needed for anxiety. Regular physical activity is also encouraged to help manage your symptoms.  -HYPOTHYROIDISM: Hypothyroidism is a condition where your thyroid  does not produce enough hormones. Your thyroid  function is well-managed with your current dose of Synthroid , so please continue taking it as prescribed.  -GENERAL HEALTH MAINTENANCE: You have lost 6 pounds since your last visit, which is a positive step. Continue with your healthy eating habits and regular physical activity. These lifestyle changes are beneficial for your overall health.  INSTRUCTIONS:  Please follow up with your gynecologist regarding your paraovarian cyst. Continue monitoring your blood pressure weekly and make an appointment if you notice any significant changes. We will also need to review the results of your H. pylori breath test once completed. If you have any  concerns or experience any new symptoms, please schedule a follow-up appointment.

## 2023-08-07 ENCOUNTER — Ambulatory Visit: Payer: Self-pay | Admitting: Internal Medicine

## 2023-08-07 ENCOUNTER — Other Ambulatory Visit: Payer: Self-pay | Admitting: Internal Medicine

## 2023-08-07 LAB — H. PYLORI BREATH TEST: H pylori Breath Test: POSITIVE — AB

## 2023-08-07 LAB — H. PYLORI BREATH COLLECTION

## 2023-08-07 MED ORDER — BISMUTH SUBSALICYLATE 262 MG PO CHEW: CHEWABLE_TABLET | ORAL | 0 refills | Status: DC

## 2023-08-07 MED ORDER — TETRACYCLINE HCL 500 MG PO CAPS: 500.0000 mg | ORAL_CAPSULE | Freq: Four times a day (QID) | ORAL | 0 refills | Status: DC

## 2023-08-07 MED ORDER — OMEPRAZOLE 20 MG PO CPDR: 20.0000 mg | DELAYED_RELEASE_CAPSULE | Freq: Two times a day (BID) | ORAL | 0 refills | Status: DC

## 2023-08-07 MED ORDER — METRONIDAZOLE 500 MG PO TABS: 500.0000 mg | ORAL_TABLET | Freq: Three times a day (TID) | ORAL | 0 refills | Status: AC

## 2023-08-12 ENCOUNTER — Other Ambulatory Visit: Payer: Self-pay | Admitting: Internal Medicine

## 2023-08-14 ENCOUNTER — Other Ambulatory Visit: Payer: Self-pay | Admitting: Internal Medicine

## 2023-08-22 ENCOUNTER — Ambulatory Visit: Admitting: Radiology

## 2023-09-08 ENCOUNTER — Other Ambulatory Visit: Payer: Self-pay | Admitting: Internal Medicine

## 2023-09-09 NOTE — Telephone Encounter (Signed)
 Requested medication (s) are due for refill today - unsure  Requested medication (s) are on the active medication list -yes  Future visit scheduled -yes  Last refill: 12/21/22 #12 1RF  Notes to clinic: high dose medication- requires provider review   Requested Prescriptions  Pending Prescriptions Disp Refills   Vitamin D , Ergocalciferol , (DRISDOL ) 1.25 MG (50000 UNIT) CAPS capsule [Pharmacy Med Name: VITAMIN D2 50,000IU (ERGO) CAP RX] 12 capsule 1    Sig: TAKE 1 CAPSULE BY MOUTH EVERY 7 DAYS     Endocrinology:  Vitamins - Vitamin D  Supplementation 2 Failed - 09/09/2023  4:23 PM      Failed - Manual Review: Route requests for 50,000 IU strength to the provider      Failed - Ca in normal range and within 360 days    Calcium  Date Value Ref Range Status  06/26/2022 9.0 8.7 - 10.2 mg/dL Final         Failed - Vitamin D  in normal range and within 360 days    Vit D, 25-Hydroxy  Date Value Ref Range Status  12/20/2022 13.3 (L) 30.0 - 100.0 ng/mL Final    Comment:    Vitamin D  deficiency has been defined by the Institute of Medicine and an Endocrine Society practice guideline as a level of serum 25-OH vitamin D  less than 20 ng/mL (1,2). The Endocrine Society went on to further define vitamin D  insufficiency as a level between 21 and 29 ng/mL (2). 1. IOM (Institute of Medicine). 2010. Dietary reference    intakes for calcium and D. [place]: The    [employer]. 2. Holick MF, Binkley [place], Bischoff-Ferrari HA, et al.    Evaluation, treatment, and prevention of vitamin D     deficiency: an Endocrine Society clinical practice    guideline. JCEM. 2011 Jul; 96(7):1911-30.          Passed - Valid encounter within last 12 months    Recent Outpatient Visits           1 month ago Abdominal bloating   [facility] Of [place]. [facility] Vicci Sober B, MD   6 months ago Morbid obesity with body mass index (BMI) of 40.0 or higher  (HCC)   [facility] Of [place]. [facility] Vicci Sober NOVAK, MD   8 months ago Sciatica, right side   [place] [facility] [place] - A Dept Of [place]. [facility] Vicci Sober NOVAK, MD   10 months ago Acquired hypothyroidism   [facility] [facility] [place] - A Dept Of [place]. [facility] Vicci Sober B, MD   1 year ago Morbid obesity with body mass index (BMI) of 40.0 or higher (HCC)   [facility] [facility] Dept Of [place]. [facility] Vicci Sober NOVAK, MD       Future Appointments             In 3 months Vicci Sober NOVAK, MD [facility] - A Dept Of [place]. [facility]               Requested Prescriptions  Pending Prescriptions Disp Refills   Vitamin D , Ergocalciferol , (DRISDOL ) 1.25 MG (50000 UNIT) CAPS capsule [Pharmacy Med Name: VITAMIN D2 50,000IU (ERGO) CAP RX] 12 capsule 1    Sig: TAKE 1 CAPSULE BY MOUTH EVERY 7 DAYS  Endocrinology:  Vitamins - Vitamin D  Supplementation 2 Failed - 09/09/2023  4:23 PM      Failed - Manual Review: Route requests for 50,000 IU strength to the provider      Failed - Ca in normal range and within 360 days    Calcium  Date Value Ref Range Status  06/26/2022 9.0 8.7 - 10.2 mg/dL Final         Failed - Vitamin D  in normal range and within 360 days    Vit D, 25-Hydroxy  Date Value Ref Range Status  12/20/2022 13.3 (L) 30.0 - 100.0 ng/mL Final    Comment:    Vitamin D  deficiency has been defined by the Institute of Medicine and an Endocrine Society practice guideline as a level of serum 25-OH vitamin D  less than 20 ng/mL (1,2). The Endocrine Society went on to further define vitamin D  insufficiency as a level between 21 and 29 ng/mL (2). 1. IOM (Institute of Medicine). 2010. Dietary reference    intakes for calcium and D. [place]: The    [employer]. 2. Holick MF, Binkley [place],  Bischoff-Ferrari HA, et al.    Evaluation, treatment, and prevention of vitamin D     deficiency: an Endocrine Society clinical practice    guideline. JCEM. 2011 Jul; 96(7):1911-30.          Passed - Valid encounter within last 12 months    Recent Outpatient Visits           1 month ago Abdominal bloating   [place] [facility] [place] - [facility] Dept Of [place]. [facility] Vicci Sober B, MD   6 months ago Morbid obesity with body mass index (BMI) of 40.0 or higher (HCC)   [place] [facility] - A Dept Of [place]. [facility] Vicci Sober NOVAK, MD   8 months ago Sciatica, right side   [place] [facility] [place] - A Dept Of [place]. [facility] Vicci Sober NOVAK, MD   10 months ago Acquired hypothyroidism   [facility] [facility] [place] [facility] Dept Of [place]. [facility] Vicci Sober B, MD   1 year ago Morbid obesity with body mass index (BMI) of 40.0 or higher (HCC)   [place] [facility] - A Dept Of [place]. [facility] Vicci Sober NOVAK, MD       Future Appointments             In 3 months Vicci Sober NOVAK, MD [facility] - A Dept Of [place]. [facility]

## 2023-09-23 ENCOUNTER — Encounter: Payer: Self-pay | Admitting: Internal Medicine

## 2023-09-23 ENCOUNTER — Other Ambulatory Visit: Payer: Self-pay | Admitting: Internal Medicine

## 2023-09-23 DIAGNOSIS — E039 Hypothyroidism, unspecified: Secondary | ICD-10-CM

## 2023-10-04 ENCOUNTER — Ambulatory Visit: Admitting: Radiology

## 2023-10-08 ENCOUNTER — Encounter: Payer: Self-pay | Admitting: Internal Medicine

## 2023-10-27 ENCOUNTER — Emergency Department (HOSPITAL_COMMUNITY)

## 2023-10-27 ENCOUNTER — Emergency Department (HOSPITAL_COMMUNITY)
Admission: EM | Admit: 2023-10-27 | Discharge: 2023-10-27 | Disposition: A | Discharge status: Home / Self Care | Attending: Emergency Medicine | Admitting: Emergency Medicine

## 2023-10-27 DIAGNOSIS — Z041 Encounter for examination and observation following transport accident: Secondary | ICD-10-CM | POA: Diagnosis not present

## 2023-10-27 DIAGNOSIS — M545 Low back pain, unspecified: Secondary | ICD-10-CM | POA: Insufficient documentation

## 2023-10-27 DIAGNOSIS — Y92410 Unspecified street and highway as the place of occurrence of the external cause: Secondary | ICD-10-CM | POA: Insufficient documentation

## 2023-10-27 DIAGNOSIS — R0789 Other chest pain: Secondary | ICD-10-CM | POA: Insufficient documentation

## 2023-10-27 DIAGNOSIS — M7918 Myalgia, other site: Secondary | ICD-10-CM

## 2023-10-27 DIAGNOSIS — M791 Myalgia, unspecified site: Secondary | ICD-10-CM | POA: Diagnosis not present

## 2023-10-27 LAB — PREGNANCY, URINE: Preg Test, Ur: NEGATIVE

## 2023-10-27 MED ORDER — METAXALONE 800 MG PO TABS: 800.0000 mg | ORAL_TABLET | Freq: Three times a day (TID) | ORAL | 0 refills | Status: DC

## 2023-10-27 MED ORDER — LIDOCAINE 5 % EX PTCH: 1.0000 | MEDICATED_PATCH | CUTANEOUS | 0 refills | Status: DC
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
## 2023-11-13 ENCOUNTER — Ambulatory Visit (INDEPENDENT_AMBULATORY_CARE_PROVIDER_SITE_OTHER): Admitting: Radiology

## 2023-11-13 ENCOUNTER — Encounter: Payer: Self-pay | Admitting: Radiology

## 2023-11-13 VITALS — BP 144/84 | HR 95 | Ht 65.0 in | Wt 256.0 lb

## 2023-11-13 DIAGNOSIS — Z01419 Encounter for gynecological examination (general) (routine) without abnormal findings: Secondary | ICD-10-CM | POA: Diagnosis not present

## 2023-11-13 DIAGNOSIS — N760 Acute vaginitis: Secondary | ICD-10-CM | POA: Diagnosis not present

## 2023-11-13 DIAGNOSIS — Z1331 Encounter for screening for depression: Secondary | ICD-10-CM | POA: Diagnosis not present

## 2023-11-13 LAB — WET PREP FOR TRICH, YEAST, CLUE

## 2023-11-13 MED ORDER — METRONIDAZOLE 500 MG PO TABS: 500.0000 mg | ORAL_TABLET | Freq: Two times a day (BID) | ORAL | 0 refills | Status: DC

## 2023-11-13 NOTE — Patient Instructions (Signed)

## 2023-11-13 NOTE — Progress Notes (Signed)
 Sarah Hart 09-08-1995 [id number]   History:  28 y.o. G0 presents for annual exam.c/o vaginal itching and d/c after using a new detergent. Periods are still irregular, has cramping, sometimes into groin and legs.   Gynecologic History Patient's last menstrual period was 10/14/2023 (exact date). Period Duration (Days): 10 Period Pattern: (!) Irregular Menstrual Flow: Moderate Menstrual Control: Maxi pad Menstrual Control Change Freq (Hours): 3 Dysmenorrhea: (!) Mild Dysmenorrhea Symptoms: Cramping, Nausea, Diarrhea, Throbbing Contraception/Family planning: abstinence Sexually active: no Last Pap: 2023  Obstetric History OB History  Gravida Para Term Preterm AB Living  0 0 0 0 0 0  SAB IAB Ectopic Multiple Live Births  0 0 0 0 0       11/13/2023   10:37 AM 08/05/2023    2:49 PM 03/01/2023    3:48 PM  Depression screen PHQ 2/9  Decreased Interest 0 1 1  Down, Depressed, Hopeless 0 2 1  PHQ - 2 Score 0 3 2  Altered sleeping  3 0  Tired, decreased energy  2 1  Change in appetite  0 0  Feeling bad or failure about yourself   1 1  Trouble concentrating  2 1  Moving slowly or fidgety/restless  0 0  Suicidal thoughts  0 0  PHQ-9 Score  11 5  Difficult doing work/chores   Somewhat difficult     The following portions of the patient's history were reviewed and updated as appropriate: allergies, current medications, past family history, past medical history, past social history, past surgical history, and problem list.  Review of Systems  All other systems reviewed and are negative.   Past medical history, past surgical history, family history and social history were all reviewed and documented in the EPIC chart.  Exam:  Vitals:   11/13/23 1030  BP: (!) 144/84  Pulse: 95  SpO2: 99%  Weight: 256 lb (116.1 kg)  Height: 5' 5 (1.651 m)   Body mass index is 42.6 kg/m.  Physical Exam Vitals and nursing note reviewed. Exam conducted with a chaperone present.   Constitutional:      Appearance: Normal appearance. She is obese.  HENT:     Head: Normocephalic and atraumatic.  Neck:     Thyroid : No thyroid  mass, thyromegaly or thyroid  tenderness.  Cardiovascular:     Rate and Rhythm: Regular rhythm.     Heart sounds: Normal heart sounds.  Pulmonary:     Effort: Pulmonary effort is normal.     Breath sounds: Normal breath sounds.  Chest:  Breasts:    Breasts are symmetrical.     Right: Normal. No inverted nipple, mass, nipple discharge, skin change or tenderness.     Left: Normal. No inverted nipple, mass, nipple discharge, skin change or tenderness.  Abdominal:     General: Abdomen is flat. Bowel sounds are normal.     Palpations: Abdomen is soft.  Genitourinary:    General: Normal vulva.     Vagina: Vaginal discharge present. No bleeding or lesions.     Cervix: Normal. No discharge or lesion.     Uterus: Normal. Not enlarged and not tender.      Adnexa: Right adnexa normal and left adnexa normal.       Right: No mass, tenderness or fullness.         Left: No mass, tenderness or fullness.    Lymphadenopathy:     Upper Body:     Right upper body: No axillary adenopathy.  Left upper body: No axillary adenopathy.  Skin:    General: Skin is warm and dry.  Neurological:     Mental Status: She is alert and oriented to person, place, and time.  Psychiatric:        Mood and Affect: Mood normal.        Thought Content: Thought content normal.        Judgment: Judgment normal.     Dereck Keas, CMA present for exam  Microscopic wet-mount exam shows clue cells.   Assessment/Plan:   1. Well woman exam with routine gynecological exam (Primary) Pap 2026 Labs with PCP  2. Acute vaginitis - WET PREP FOR TRICH, YEAST, CLUE +BV - metroNIDAZOLE  (FLAGYL ) 500 MG tablet; Take 1 tablet (500 mg total) by mouth 2 (two) times daily.  Dispense: 14 tablet; Refill: 0  3. Depression screen    Return in about 1 year (around 11/12/2024) for  Annual.  GINETTE SHASTA NOVAK WHNP-BC 10:47 AM 11/13/2023

## 2023-11-14 DIAGNOSIS — L732 Hidradenitis suppurativa: Secondary | ICD-10-CM | POA: Diagnosis not present

## 2023-11-15 ENCOUNTER — Encounter: Payer: Self-pay | Admitting: Family

## 2023-11-15 ENCOUNTER — Ambulatory Visit (INDEPENDENT_AMBULATORY_CARE_PROVIDER_SITE_OTHER): Admitting: Family

## 2023-11-15 DIAGNOSIS — I10 Essential (primary) hypertension: Secondary | ICD-10-CM

## 2023-11-15 DIAGNOSIS — M7918 Myalgia, other site: Secondary | ICD-10-CM

## 2023-11-15 MED ORDER — CYCLOBENZAPRINE HCL 5 MG PO TABS: 5.0000 mg | ORAL_TABLET | Freq: Three times a day (TID) | ORAL | 1 refills | Status: AC | PRN
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
## 2023-12-03 ENCOUNTER — Ambulatory Visit
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
## 2023-12-06 ENCOUNTER — Other Ambulatory Visit: Payer: Self-pay

## 2023-12-06 ENCOUNTER — Ambulatory Visit: Attending: Family

## 2023-12-06 DIAGNOSIS — R262 Difficulty in walking, not elsewhere classified: Secondary | ICD-10-CM | POA: Diagnosis not present

## 2023-12-06 DIAGNOSIS — M5441 Lumbago with sciatica, right side: Secondary | ICD-10-CM | POA: Insufficient documentation

## 2023-12-06 DIAGNOSIS — M6281 Muscle weakness (generalized): Secondary | ICD-10-CM | POA: Insufficient documentation

## 2023-12-09 ENCOUNTER — Encounter: Payer: Self-pay | Admitting: Internal Medicine

## 2023-12-09 ENCOUNTER — Ambulatory Visit: Attending: Internal Medicine | Admitting: Internal Medicine

## 2023-12-09 VITALS — BP 126/84 | HR 91 | Temp 98.3°F | Ht 65.0 in | Wt 260.0 lb

## 2023-12-09 DIAGNOSIS — E039 Hypothyroidism, unspecified: Secondary | ICD-10-CM

## 2023-12-09 DIAGNOSIS — M5431 Sciatica, right side: Secondary | ICD-10-CM

## 2023-12-09 DIAGNOSIS — Z23 Encounter for immunization: Secondary | ICD-10-CM | POA: Diagnosis not present

## 2023-12-09 DIAGNOSIS — E559 Vitamin D deficiency, unspecified: Secondary | ICD-10-CM | POA: Diagnosis not present

## 2023-12-09 DIAGNOSIS — R002 Palpitations: Secondary | ICD-10-CM

## 2023-12-09 DIAGNOSIS — F411 Generalized anxiety disorder: Secondary | ICD-10-CM

## 2023-12-09 DIAGNOSIS — Z6841 Body Mass Index (BMI) 40.0 and over, adult: Secondary | ICD-10-CM

## 2023-12-09 DIAGNOSIS — I10 Essential (primary) hypertension: Secondary | ICD-10-CM | POA: Diagnosis not present

## 2023-12-09 MED ORDER — AMLODIPINE BESYLATE 5 MG PO TABS
ORAL_TABLET | ORAL | 2 refills | Status: DC
Start: 2023-12-09 — End: 2023-12-11
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
## 2023-12-09 NOTE — Patient Instructions (Addendum)
  VISIT SUMMARY: Today, you came in for a follow-up visit to manage your hypertension and sciatica. We discussed your current medications, recent symptoms, and lifestyle modifications. Your blood pressure readings are within the target range, and you are actively working on your diet and exercise. We also addressed your sciatica, hypothyroidism, and depression.  YOUR PLAN: -ESSENTIAL HYPERTENSION: Hypertension is high blood pressure. Your blood pressure is well-controlled with your current dose of amlodipine . Please continue taking 2.5 mg daily and maintain your lifestyle modifications, including diet and exercise.  -MORBID OBESITY DUE TO EXCESS CALORIES: Morbid obesity is a condition where excess body fat negatively affects your health. You are actively managing your weight through exercise and dietary changes. Please continue to be mindful of portion sizes and carbohydrate intake.  -SCIATICA, RIGHT SIDE: Sciatica is pain that radiates along the path of the sciatic nerve, which runs down one or both legs from the lower back. Your sciatica is being managed with physical therapy and at-home stretches. Continue with these treatments to help manage your pain.  -HYPOTHYROIDISM: Hypothyroidism is a condition where your thyroid  gland doesn't produce enough thyroid  hormone. Your condition is managed with levothyroxine . We have ordered a thyroid  function test to check your levels. Please continue your current dose of levothyroxine .  -DEPRESSION: Depression is a mood disorder that causes persistent feelings of sadness and loss of interest. Your depression is well-managed with Zoloft . Please continue your current dose.  Palpitations: we will check your thyroid  level. If normal, please let me know if the palpitations become more frequent.  INSTRUCTIONS: Please follow up with the thyroid  function test as ordered. Continue with your current medications and lifestyle modifications. If you experience any new or  worsening symptoms, please contact our office.                      Contains text generated by [employer]bridge.                                 Contains text generated by [employer]bridge.

## 2023-12-10 ENCOUNTER — Ambulatory Visit: Payer: Self-pay | Admitting: Internal Medicine

## 2023-12-10 ENCOUNTER — Ambulatory Visit

## 2023-12-10 ENCOUNTER — Encounter: Payer: Self-pay | Admitting: Internal Medicine

## 2023-12-10 DIAGNOSIS — R262 Difficulty in walking, not elsewhere classified: Secondary | ICD-10-CM

## 2023-12-10 DIAGNOSIS — M5441 Lumbago with sciatica, right side: Secondary | ICD-10-CM

## 2023-12-10 DIAGNOSIS — M6281 Muscle weakness (generalized): Secondary | ICD-10-CM

## 2023-12-10 LAB — TSH: TSH: 1.77 u[IU]/mL (ref 0.450–4.500)

## 2023-12-10 LAB — VITAMIN D 25 HYDROXY (VIT D DEFICIENCY, FRACTURES): Vit D, 25-Hydroxy: 19.9 ng/mL — ABNORMAL LOW (ref 30.0–100.0)

## 2023-12-11 ENCOUNTER — Other Ambulatory Visit: Payer: Self-pay | Admitting: Internal Medicine

## 2023-12-11 MED ORDER — AMLODIPINE BESYLATE 2.5 MG PO TABS: 2.5000 mg | ORAL_TABLET | Freq: Every day | ORAL | 1 refills | Status: AC
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
## 2023-12-18 ENCOUNTER — Ambulatory Visit

## 2023-12-18 DIAGNOSIS — M5441 Lumbago with sciatica, right side: Secondary | ICD-10-CM | POA: Diagnosis not present

## 2023-12-18 DIAGNOSIS — R262 Difficulty in walking, not elsewhere classified: Secondary | ICD-10-CM | POA: Insufficient documentation

## 2023-12-18 DIAGNOSIS — M6281 Muscle weakness (generalized): Secondary | ICD-10-CM | POA: Insufficient documentation

## 2023-12-19 ENCOUNTER — Ambulatory Visit

## 2023-12-23 ENCOUNTER — Ambulatory Visit: Admitting: Physical Therapy

## 2023-12-23 DIAGNOSIS — M6281 Muscle weakness (generalized): Secondary | ICD-10-CM | POA: Diagnosis not present

## 2023-12-23 DIAGNOSIS — R262 Difficulty in walking, not elsewhere classified: Secondary | ICD-10-CM | POA: Diagnosis not present

## 2023-12-23 DIAGNOSIS — M5441 Lumbago with sciatica, right side: Secondary | ICD-10-CM | POA: Diagnosis not present
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
## 2023-12-25 ENCOUNTER — Ambulatory Visit

## 2023-12-26 ENCOUNTER — Encounter: Payer: Self-pay | Admitting: Physical Therapy

## 2023-12-26 ENCOUNTER — Ambulatory Visit: Admitting: Physical Therapy

## 2023-12-26 DIAGNOSIS — R262 Difficulty in walking, not elsewhere classified: Secondary | ICD-10-CM

## 2023-12-26 DIAGNOSIS — M5441 Lumbago with sciatica, right side: Secondary | ICD-10-CM

## 2023-12-26 DIAGNOSIS — M6281 Muscle weakness (generalized): Secondary | ICD-10-CM | POA: Diagnosis not present
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
## 2024-01-01 ENCOUNTER — Ambulatory Visit

## 2024-01-01 DIAGNOSIS — M6281 Muscle weakness (generalized): Secondary | ICD-10-CM

## 2024-01-01 DIAGNOSIS — M5441 Lumbago with sciatica, right side: Secondary | ICD-10-CM

## 2024-01-01 DIAGNOSIS — R262 Difficulty in walking, not elsewhere classified: Secondary | ICD-10-CM | POA: Diagnosis not present
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
## 2024-01-03 ENCOUNTER — Ambulatory Visit
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
## 2024-01-14 ENCOUNTER — Ambulatory Visit

## 2024-01-14 DIAGNOSIS — R262 Difficulty in walking, not elsewhere classified: Secondary | ICD-10-CM

## 2024-01-14 DIAGNOSIS — M6281 Muscle weakness (generalized): Secondary | ICD-10-CM

## 2024-01-14 DIAGNOSIS — M5441 Lumbago with sciatica, right side: Secondary | ICD-10-CM

## 2024-01-20 ENCOUNTER — Encounter (INDEPENDENT_AMBULATORY_CARE_PROVIDER_SITE_OTHER): Payer: Self-pay | Admitting: Otolaryngology

## 2024-01-20 ENCOUNTER — Ambulatory Visit (INDEPENDENT_AMBULATORY_CARE_PROVIDER_SITE_OTHER): Admitting: Otolaryngology

## 2024-01-20 VITALS — BP 148/78 | Ht 64.0 in | Wt 255.0 lb

## 2024-01-20 DIAGNOSIS — J343 Hypertrophy of nasal turbinates: Secondary | ICD-10-CM | POA: Diagnosis not present

## 2024-01-20 DIAGNOSIS — J310 Chronic rhinitis: Secondary | ICD-10-CM

## 2024-01-20 DIAGNOSIS — J342 Deviated nasal septum: Secondary | ICD-10-CM | POA: Diagnosis not present

## 2024-01-20 DIAGNOSIS — J358 Other chronic diseases of tonsils and adenoids: Secondary | ICD-10-CM

## 2024-01-20 NOTE — Progress Notes (Signed)
 Patient ID: Sarah Hart, female   DOB: 11-29-1995, 29 y.o.   MRN: [id number]  Follow up: Chronic nasal congestion, recurrent tonsil stones  History of Present Illness Sarah Hart is a 29 year old female with chronic rhinitis, septal deviation, and turbinate hypertrophy who presents for evaluation of intermittent tonsilloliths.  Her nasal congestion has remained well controlled over the past six months with continued use of Flonase  nasal spray. She has not experienced recent sinus infections, and the fall allergy season did not result in significant symptoms. She currently denies nasal congestion, sneezing, itchy eyes, or itchy nose.  She describes intermittent episodes of scratchy throat and occasional expectoration of yellow mucus, which resolve spontaneously without intervention. She attributes these episodes to viral illness or allergies. She has experienced rare tonsilloliths, including a recent episode of coughing up a tonsil stone for the first time. Tonsil stones are infrequent and not bothersome.  Exam: General: Communicates without difficulty, well nourished, no acute distress. Head: Normocephalic, no evidence injury, no tenderness, facial buttresses intact without stepoff. Face/sinus: No tenderness to palpation and percussion. Facial movement is normal and symmetric. Eyes: PERRL, EOMI. No scleral icterus, conjunctivae clear. Neuro: CN II exam reveals vision grossly intact.  No nystagmus at any point of gaze. Ears: Auricles well formed without lesions.  Ear canals are intact without mass or lesion.  No erythema or edema is appreciated.  The TMs are intact without fluid. Nose: External evaluation reveals normal support and skin without lesions.  Dorsum is intact.  Anterior rhinoscopy reveals congested mucosa over anterior aspect of inferior turbinates and deviated septum.  No purulence noted. Oral:  Oral cavity and oropharynx are intact, symmetric, without erythema or edema.  Mucosa is  moist without lesions.  Tonsil stone is noted within the left tonsillar fossa.  Neck: Full range of motion without pain.  There is no significant lymphadenopathy.  No masses palpable.  Thyroid  bed within normal limits to palpation.  Parotid glands and submandibular glands equal bilaterally without mass.  Trachea is midline. Neuro:  CN 2-12 grossly intact.    Assessment and Plan Assessment & Plan Tonsillolith She presents with an occasional tonsillolith, with a new tonsil stone visualized on the left tonsil today. Symptoms are infrequent and not bothersome. No evidence of severe infection.  - Advised that tonsilloliths are common and not concerning unless frequent or symptomatic. - Recommended use of a water pick (oral irrigator) for gentle removal if desired. - Discussed tonsillectomy as a last resort for severe or frequent cases due to the painful nature of the surgery. - Reassured that observation is acceptable if asymptomatic.  Chronic rhinitis with nasal mucosal congestion, nasal septal deviation, and bilateral inferior turbinate hypertrophy Chronic rhinitis and nasal congestion are well controlled on current therapy. Examination shows improvement compared to prior visits, with only mild congestion persisting. No acute exacerbation. - Advised continuation of Flonase  nasal spray and daily nasal saline irrigation. - Reinforced proper nasal spray technique (aiming spray away from septum) to minimize risk of epistaxis. - Provided anticipatory guidance that continued therapy should help prevent symptoms during upcoming allergy seasons. - Scheduled follow-up in six months, with instructions to return sooner if symptoms worsen.

## 2024-01-21 ENCOUNTER — Ambulatory Visit
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
## 2024-01-22 ENCOUNTER — Ambulatory Visit: Attending: Family

## 2024-01-22 DIAGNOSIS — M5441 Lumbago with sciatica, right side: Secondary | ICD-10-CM | POA: Insufficient documentation

## 2024-01-22 DIAGNOSIS — R262 Difficulty in walking, not elsewhere classified: Secondary | ICD-10-CM | POA: Diagnosis present

## 2024-01-22 DIAGNOSIS — M6281 Muscle weakness (generalized): Secondary | ICD-10-CM | POA: Diagnosis present
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
## 2024-01-23 ENCOUNTER — Ambulatory Visit

## 2024-01-23 DIAGNOSIS — M6281 Muscle weakness (generalized): Secondary | ICD-10-CM

## 2024-01-23 DIAGNOSIS — R262 Difficulty in walking, not elsewhere classified: Secondary | ICD-10-CM

## 2024-01-23 DIAGNOSIS — M5441 Lumbago with sciatica, right side: Secondary | ICD-10-CM | POA: Diagnosis not present
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
## 2024-01-29 ENCOUNTER — Encounter: Payer: Self-pay | Admitting: Internal Medicine

## 2024-01-30 NOTE — Telephone Encounter (Signed)
 Please advise if ok for a video visit. Currently no in-person appointments available at [facility].

## 2024-01-31 ENCOUNTER — Ambulatory Visit: Payer: Self-pay

## 2024-01-31 NOTE — Telephone Encounter (Signed)
 Noted. Patient has appointment scheduled for 02/05/24 to discuss

## 2024-01-31 NOTE — Telephone Encounter (Signed)
" °  FYI Only or Action Required?: FYI only for provider: appointment scheduled on 1/21.  Patient was last seen in primary care on 12/09/2023 by Vicci Barnie NOVAK, MD.  Called Nurse Triage reporting Abdominal Pain.  Symptoms began a week ago.  Interventions attempted: Nothing.  Symptoms are: stable.  Triage Disposition: See Physician Within 24 Hours  Patient/caregiver understands and will follow disposition?: Yes   Summary: Abdomen and back pain   Reason for Triage: Abdomen pain, that radiates to lower back, left and right side of abdomen. Going on for about 1 week.     Reason for Disposition  [1] MILD pain (e.g., does not interfere with normal activities) AND [2] pain comes and goes (cramps) AND [3] present > 48 hours  (Exception: This same abdominal pain is a chronic symptom recurrent or ongoing AND present > 4 weeks.)  Answer Assessment - Initial Assessment Questions Has sharp stabbing pains in abd, varies in location, lasting a few seconds.   1. LOCATION: Where does it hurt?      varies 2. RADIATION: Does the pain shoot anywhere else? (e.g., chest, back)      3. ONSET: When did the pain begin? (e.g., minutes, hours or days ago)      A week ago 4. SUDDEN: Gradual or sudden onset?     grandual 5. PATTERN Does the pain come and go, or is it constant?     intermittnet 6. SEVERITY: How bad is the pain?  (e.g., Scale 1-10; mild, moderate, or severe)     mild 7. RECURRENT SYMPTOM: Have you ever had this type of stomach pain before? If Yes, ask: When was the last time? and What happened that time?      no 8. CAUSE: What do you think is causing the stomach pain? (e.g., gallstones, recent abdominal surgery)     unknown 9. RELIEVING/AGGRAVATING FACTORS: What makes it better or worse? (e.g., antacids, bending or twisting motion, bowel movement)     no 10. OTHER SYMPTOMS: Do you have any other symptoms? (e.g., back pain, diarrhea, fever, urination pain,  vomiting)       An episode of diarrhea yesterday  Protocols used: Abdominal Pain - Female-A-AH  "

## 2024-02-04 ENCOUNTER — Telehealth: Payer: Self-pay | Admitting: Internal Medicine

## 2024-02-04 NOTE — Telephone Encounter (Signed)
 Contacted pt left vm to confirmed appt

## 2024-02-05 ENCOUNTER — Encounter: Payer: Self-pay | Admitting: Family Medicine

## 2024-02-05 ENCOUNTER — Ambulatory Visit: Payer: Self-pay | Attending: Family Medicine | Admitting: Family Medicine

## 2024-02-05 ENCOUNTER — Other Ambulatory Visit (HOSPITAL_COMMUNITY)
Admission: RE | Admit: 2024-02-05 | Discharge: 2024-02-05 | Disposition: A | Discharge status: Home / Self Care | Source: Ambulatory Visit | Attending: Family Medicine | Admitting: Family Medicine

## 2024-02-05 VITALS — BP 135/83 | HR 97 | Temp 98.0°F | Ht 64.0 in | Wt 259.8 lb

## 2024-02-05 DIAGNOSIS — R102 Pelvic and perineal pain unspecified side: Secondary | ICD-10-CM | POA: Insufficient documentation

## 2024-02-05 DIAGNOSIS — N838 Other noninflammatory disorders of ovary, fallopian tube and broad ligament: Secondary | ICD-10-CM | POA: Diagnosis not present

## 2024-02-05 LAB — POCT URINALYSIS DIP (CLINITEK)
Bilirubin, UA: NEGATIVE
Glucose, UA: NEGATIVE mg/dL
Ketones, POC UA: NEGATIVE mg/dL
Nitrite, UA: NEGATIVE
POC PROTEIN,UA: NEGATIVE
Spec Grav, UA: 1.025
Urobilinogen, UA: 0.2 U/dL
pH, UA: 5.5

## 2024-02-05 NOTE — Progress Notes (Signed)
 "  Subjective:  Patient ID: Sarah Hart, female    DOB: [birth year]  Age: 29 y.o. MRN: [id number]  CC: Abdominal Pain (Left and Right Abdominal pain X 1 month )     Discussed the use of AI scribe software for clinical note transcription with the patient, who gave verbal consent to proceed.  History of Present Illness Sarah Hart is a 29 year old female patient of Dr. Vicci with a hypertension, allergic rhinitis, obesitywho presents with one month of intermittent lower abdominal pain.  She has had intermittent lower abdominal pain for the past month, beginning shortly after her menstrual period ended on January 8th or 9th. The pain is dull and sometimes feels like a pinch, occurs on either the left or right lower abdomen (never both at once), lasts a few seconds, occurs at least daily, and sometimes radiates slightly to the side of her back.  She has a known small right paratubal cyst noted on ultrasound of the pelvis from 2023 that has not been reimaged recently. Her menstrual cycles are irregular, and she was previously told she may have PCOS. Before her last period, she had 3 months of amenorrhea, and the last period lasted about 7 to 8 days. She is not currently sexually active. A pelvic ultrasound in 2023 showed a right adnexal cyst.  She denies nausea, vomiting, abnormal vaginal bleeding, discharge, or dysuria.     Past Medical History:  Diagnosis Date   Anxiety    Hypothyroidism     No past surgical history on file.  Family History  Problem Relation Age of Onset   Hypertension Mother    Breast cancer Mother        Mother was dx in 2009   Hypertension Father    Multiple myeloma Father 65   Asthma Brother    Pancreatic cancer Maternal Grandmother    Prostate cancer Maternal Grandfather     Social History   Socioeconomic History   Marital status: Single    Spouse name: Not on file   Number of children: Not on file   Years of education: Not on file    Highest education level: Master's degree (e.g., MA, MS, MEng, MEd, MSW, MBA)  Occupational History   Not on file  Tobacco Use   Smoking status: Never    Passive exposure: Never   Smokeless tobacco: Never  Vaping Use   Vaping status: Never Used  Substance and Sexual Activity   Alcohol use: Not Currently    Comment: once or twice a month   Drug use: No   Sexual activity: Never    Partners: Male    Comment: menarche 12yo  Other Topics Concern   Not on file  Social History Narrative   Not on file   Social Drivers of Health   Tobacco Use: Low Risk (01/23/2024)   Patient History    Smoking Tobacco Use: Never    Smokeless Tobacco Use: Never    Passive Exposure: Never  Financial Resource Strain: Low Risk (12/08/2023)   Overall Financial Resource Strain (CARDIA)    Difficulty of Paying Living Expenses: Not very hard  Food Insecurity: Food Insecurity Present (12/08/2023)   Epic    Worried About Programme Researcher, Broadcasting/film/video in the Last Year: Sometimes true    Ran Out of Food in the Last Year: Sometimes true  Transportation Needs: No Transportation Needs (12/08/2023)   Epic    Lack of Transportation (Medical): No    Lack of Transportation (Non-Medical): No  Physical Activity: Sufficiently Active (12/08/2023)   Exercise Vital Sign    Days of Exercise per Week: 3 days    Minutes of Exercise per Session: 70 min  Stress: No Stress Concern Present (12/08/2023)   [employer] of Occupational Health - Occupational Stress Questionnaire    Feeling of Stress: Only a little  Social Connections: Moderately Isolated (12/08/2023)   Social Connection and Isolation Panel    Frequency of Communication with Friends and Family: More than three times a week    Frequency of Social Gatherings with Friends and Family: More than three times a week    Attends Religious Services: 1 to 4 times per year    Active Member of Clubs or Organizations: No    Attends Banker Meetings: Not on file     Marital Status: Never married  Depression (PHQ2-9): Low Risk (12/09/2023)   Depression (PHQ2-9)    PHQ-2 Score: 1  Alcohol Screen: Low Risk (12/08/2023)   Alcohol Screen    Last Alcohol Screening Score (AUDIT): 2  Housing: Low Risk (12/08/2023)   Epic    Unable to Pay for Housing in the Last Year: No    Number of Times Moved in the Last Year: 0    Homeless in the Last Year: No  Utilities: Not At Risk (10/26/2022)   [facility] Utilities    Threatened with loss of utilities: No  Health Literacy: Adequate Health Literacy (10/26/2022)   B1300 Health Literacy    Frequency of need for help with medical instructions: Never    Allergies[1]  Outpatient Medications Prior to Visit  Medication Sig Dispense Refill   amLODipine  (NORVASC ) 2.5 MG tablet Take 1 tablet (2.5 mg total) by mouth daily. 90 tablet 1   fluticasone  (FLONASE ) 50 MCG/ACT nasal spray Place 2 sprays into both nostrils daily. 16 g 10   hydrOXYzine  (ATARAX ) 10 MG tablet Take 1 tablet (10 mg total) by mouth at bedtime as needed. 30 tablet 3   sertraline  (ZOLOFT ) 100 MG tablet TAKE 1 TABLET BY MOUTH DAILY 90 tablet 1   SYNTHROID  50 MCG tablet TAKE 1 TABLET(50 MCG) BY MOUTH DAILY 90 tablet 1   valACYclovir  (VALTREX ) 1000 MG tablet Take 1 tablet (1,000 mg total) by mouth 3 (three) times daily. 21 tablet 3   Vitamin D , Ergocalciferol , (DRISDOL ) 1.25 MG (50000 UNIT) CAPS capsule TAKE 1 CAPSULE BY MOUTH EVERY 7 DAYS 12 capsule 1   cyclobenzaprine  (FLEXERIL ) 5 MG tablet Take 1 tablet (5 mg total) by mouth 3 (three) times daily as needed for muscle spasms. (Patient not taking: Reported on 02/05/2024) 90 tablet 1   No facility-administered medications prior to visit.     ROS Review of Systems  Constitutional:  Negative for activity change and appetite change.  HENT:  Negative for sinus pressure and sore throat.   Respiratory:  Negative for chest tightness, shortness of breath and wheezing.   Cardiovascular:  Negative for chest pain and  palpitations.  Gastrointestinal:  Positive for abdominal pain. Negative for abdominal distention and constipation.  Genitourinary: Negative.  Negative for flank pain.  Musculoskeletal: Negative.   Psychiatric/Behavioral:  Negative for behavioral problems and dysphoric mood.     Objective:  BP 135/83   Pulse 97   Temp 98 F (36.7 C) (Oral)   Ht 5' 4 (1.626 m)   Wt 259 lb 12.8 oz (117.8 kg)   LMP 01/23/2024 (Exact Date)   SpO2 100%   BMI 44.59 kg/m      02/05/2024  11:10 AM 01/20/2024    4:21 PM 12/09/2023    2:30 PM  BP/Weight  Systolic BP 135 148 126  Diastolic BP 83 78 84  Wt. (Lbs) 259.8 255 260  BMI 44.59 kg/m2 43.77 kg/m2 43.27 kg/m2      Physical Exam Constitutional:      Appearance: She is well-developed.  Cardiovascular:     Rate and Rhythm: Normal rate.     Heart sounds: Normal heart sounds. No murmur heard. Pulmonary:     Effort: Pulmonary effort is normal.     Breath sounds: Normal breath sounds. No wheezing or rales.  Chest:     Chest wall: No tenderness.  Abdominal:     General: Bowel sounds are normal. There is no distension.     Palpations: Abdomen is soft. There is no mass.     Tenderness: There is no abdominal tenderness. There is no right CVA tenderness or left CVA tenderness.  Musculoskeletal:        General: Normal range of motion.     Right lower leg: No edema.     Left lower leg: No edema.  Neurological:     Mental Status: She is alert and oriented to person, place, and time.  Psychiatric:        Mood and Affect: Mood normal.        Latest Ref Rng & Units 06/26/2022    4:37 PM 10/28/2021    4:29 AM 10/26/2021    7:06 PM  CMP  Glucose 70 - 99 mg/dL 91  862  898   BUN 6 - 20 mg/dL 14  10  9    Creatinine 0.57 - 1.00 mg/dL 9.17  9.11  9.17   Sodium 134 - 144 mmol/L 138  136  139   Potassium 3.5 - 5.2 mmol/L 3.9  3.1  3.7   Chloride 96 - 106 mmol/L 104  106  107   CO2 20 - 29 mmol/L 20  25  25    Calcium 8.7 - 10.2 mg/dL 9.0  9.0   9.5   Total Protein 6.5 - 8.1 g/dL  7.9  8.5   Total Bilirubin 0.3 - 1.2 mg/dL  0.8  0.9   Alkaline Phos 38 - 126 U/L  91  99   AST 15 - 41 U/L  15  18   ALT 0 - 44 U/L  22  29     Lipid Panel  No results found for: CHOL, TRIG, HDL, CHOLHDL, VLDL, LDLCALC, LDLDIRECT  CBC    Component Value Date/Time   WBC 10.8 02/27/2022 1618   WBC 10.1 10/28/2021 0429   RBC 4.57 02/27/2022 1618   RBC 4.61 10/28/2021 0429   HGB 12.5 02/27/2022 1618   HCT 39.3 02/27/2022 1618   PLT 452 (H) 02/27/2022 1618   MCV 86 02/27/2022 1618   MCH 27.4 02/27/2022 1618   MCH 27.3 10/28/2021 0429   MCHC 31.8 02/27/2022 1618   MCHC 32.0 10/28/2021 0429   RDW 13.2 02/27/2022 1618   LYMPHSABS 2.3 12/19/2021 0951   MONOABS 0.7 10/28/2021 0429   EOSABS 0.2 12/19/2021 0951   BASOSABS 0.0 12/19/2021 0951    Lab Results  Component Value Date   HGBA1C 5.6 02/27/2022       Assessment & Plan Chronic pelvic pain Intermittent mild pelvic pain, differential includes gynecological causes and less likely kidney stones. - Ordered pelvic and transvaginal ultrasound for fibroids, cysts, or endometriosis. -UA reveals small blood and trace leukocyte esterase, negative  nitrite - Ordered urine culture to rule out UTI. - Advised anti-inflammatory medication for intense pain. - Instructed to report severe or flank pain for possible CT renal stone protocol  Paratubal cyst Right paratubal cyst identified on previous pelvic ultrasound in the past, possible relation to current symptoms.        No orders of the defined types were placed in this encounter.   Follow-up: Return for previously scheduled appointment, Medical conditions with PCP.       Corrina Sabin, MD, FAAFP. [facility] [place], [place] [phone]   02/05/2024, 3:03 PM     [1] No Known Allergies  "

## 2024-02-05 NOTE — Patient Instructions (Signed)
 VISIT SUMMARY:  During your visit, we discussed your intermittent lower abdominal pain that has been occurring for the past month. We reviewed your history of a right paratubal cyst and irregular menstrual cycles, and noted the presence of small blood in your urine.  YOUR PLAN:  -CHRONIC PELVIC PAIN: Chronic pelvic pain refers to pain in the lower abdomen that lasts for six months or longer. We have ordered a pelvic and transvaginal ultrasound to check for fibroids, cysts, or endometriosis. Additionally, a urine culture has been ordered to rule out a urinary tract infection (UTI). You can take anti-inflammatory medication if the pain becomes intense. Please report any severe or flank pain, as this may require a CT scan.  -PARATUBAL CYST: A paratubal cyst is a fluid-filled sac located near the fallopian tube. Your known right paratubal cyst may be related to your current symptoms. We will monitor this condition closely.  -IRREGULAR MENSTRUATION: Irregular menstruation means that your menstrual cycles are not regular. Given your history of irregular cycles and previous suspicion of polycystic ovary syndrome (PCOS), we will continue to monitor your menstrual health.  -MICROSCOPIC HEMATURIA: Microscopic hematuria means there is a small amount of blood in your urine that is not visible to the naked eye. We have sent your urine for culture to identify any bacterial growth and sensitivities. We will also monitor for any symptoms that may suggest kidney stones.  INSTRUCTIONS:  Please follow up with the results of your pelvic and transvaginal ultrasound and urine culture. If you experience severe or flank pain, contact us  immediately as you may need a CT scan.

## 2024-02-06 LAB — CERVICOVAGINAL ANCILLARY ONLY
Bacterial Vaginitis (gardnerella): NEGATIVE
Candida Glabrata: NEGATIVE
Candida Vaginitis: NEGATIVE
Chlamydia: NEGATIVE
Comment: NEGATIVE
Comment: NEGATIVE
Comment: NEGATIVE
Comment: NEGATIVE
Comment: NEGATIVE
Comment: NORMAL
Neisseria Gonorrhea: NEGATIVE
Trichomonas: NEGATIVE

## 2024-02-07 ENCOUNTER — Ambulatory Visit: Payer: Self-pay | Admitting: Family Medicine

## 2024-02-18 ENCOUNTER — Other Ambulatory Visit

## 2024-03-03 ENCOUNTER — Other Ambulatory Visit

## 2024-04-07 ENCOUNTER — Ambulatory Visit: Admitting: Internal Medicine

## 2024-07-20 ENCOUNTER — Ambulatory Visit (INDEPENDENT_AMBULATORY_CARE_PROVIDER_SITE_OTHER): Admitting: Otolaryngology
